# Patient Record
Sex: Female | Born: 1965 | Race: White | Hispanic: No | Smoking: Former smoker
Health system: Southern US, Community
[De-identification: ages and names within clinical notes are randomized; demographics above are authoritative.]

## PROBLEM LIST (undated history)

## (undated) DIAGNOSIS — F419 Anxiety disorder, unspecified: Secondary | ICD-10-CM

## (undated) DIAGNOSIS — E079 Disorder of thyroid, unspecified: Secondary | ICD-10-CM

## (undated) HISTORY — DX: Anxiety disorder, unspecified: F41.9

## (undated) HISTORY — DX: Disorder of thyroid, unspecified: E07.9

## (undated) HISTORY — PX: EYE SURGERY: SHX253

## (undated) HISTORY — PX: ABLATION: SHX5711

## (undated) HISTORY — PX: DILATION AND CURETTAGE OF UTERUS: SHX78

## (undated) HISTORY — PX: CHOLECYSTECTOMY: SHX55

## (undated) HISTORY — PX: OTHER SURGICAL HISTORY: SHX169

## (undated) HISTORY — PX: OOPHORECTOMY: SHX86

---

## 2016-11-02 ENCOUNTER — Ambulatory Visit (INDEPENDENT_AMBULATORY_CARE_PROVIDER_SITE_OTHER): Payer: 59 | Admitting: Obstetrics & Gynecology

## 2016-11-02 ENCOUNTER — Encounter: Payer: Self-pay | Admitting: Obstetrics & Gynecology

## 2016-11-02 VITALS — BP 118/80 | Wt 203.4 lb

## 2016-11-02 DIAGNOSIS — N914 Secondary oligomenorrhea: Secondary | ICD-10-CM

## 2016-11-02 DIAGNOSIS — R102 Pelvic and perineal pain: Secondary | ICD-10-CM | POA: Diagnosis not present

## 2016-11-02 LAB — CBC
HEMATOCRIT: 39.7 % (ref 35.0–45.0)
HEMOGLOBIN: 13.5 g/dL (ref 11.7–15.5)
MCH: 30.3 pg (ref 27.0–33.0)
MCHC: 34 g/dL (ref 32.0–36.0)
MCV: 89 fL (ref 80.0–100.0)
MPV: 9.3 fL (ref 7.5–12.5)
Platelets: 323 10*3/uL (ref 140–400)
RBC: 4.46 MIL/uL (ref 3.80–5.10)
RDW: 13.3 % (ref 11.0–15.0)
WBC: 9.6 10*3/uL (ref 3.8–10.8)

## 2016-11-02 NOTE — Patient Instructions (Signed)
Your Gynecologic exam revealed a tender uterus, but was otherwise normal.  Labs were drawn including a CBC and a sPT.  Please f/u with a Pelvic US asap.  We will obtain your Medical record from [facility]. It was a pleasure to see you today.

## 2016-11-02 NOTE — Progress Notes (Signed)
    Claudia Matthews 14-Sep-1965 [id number]        51 y.o.  [id number] Married  Declines contraception.  H/O Endometrial Ablation and Lt Oophorectomy.  Aex 09/2016 with ASCUS/HPV HR neg.  Seen by Urology last week.  U/A neg, no hematuria and no cystitis.  Established patient presenting for Pelvic Pain  Pelvic crampy pain x 5 days.  Had a light vaginal bleeding x 3 days starting 4/16th. Oligo x many years, s/p Endometrial ablation and Lt Oophorectomy.  Started having pelvic cramping with mid line pain the day after, on 4/19th.  Cramping persists x 5 days.  No vaginal d/c.  No fever.   Past medical history,surgical history, problem list, medications, allergies, family history and social history were all reviewed and documented in the EPIC chart.  Directed ROS with pertinent positives and negatives documented in the history of present illness/assessment and plan.  Exam:  Vitals:   11/02/16 1430  BP: 118/80  Weight: 203 lb 6.4 oz (92.3 kg)   General appearance:  Normal  Gyn exam:  Uterus AV, normal volume, mildly tender.                     No adnexal mass (S/P Lt Oophorectomy)  Assessment/Plan:  51 y.o. [id number] presenting for Pelvic Pain.  1. Female pelvic pain Uterus tender on Bimanual exam, otherwise pelvic exam normal.  Declines STI screen.  No contraception, declines.  S/P Lt Oophorectomy.  Will assess Rt Ovary by [place]. - CBC - hCG, serum, qualitative - US Transvaginal Non-OB; Future  2. Secondary oligomenorrhea Probably Perimenopausal.  Recent light menses x 3 days 4/16th.  F/U Pelvic US to assess Endometrial Line.  Counseling >50% x 25 min on above issues.  Genia Del MD, 3:02 PM 11/02/2016

## 2016-11-03 ENCOUNTER — Ambulatory Visit: Payer: Self-pay | Admitting: Obstetrics & Gynecology

## 2016-11-03 LAB — HCG, SERUM, QUALITATIVE: PREG SERUM: NEGATIVE

## 2016-11-04 ENCOUNTER — Other Ambulatory Visit: Payer: Self-pay | Admitting: Obstetrics & Gynecology

## 2016-11-04 ENCOUNTER — Ambulatory Visit (INDEPENDENT_AMBULATORY_CARE_PROVIDER_SITE_OTHER): Payer: 59 | Admitting: Obstetrics & Gynecology

## 2016-11-04 ENCOUNTER — Ambulatory Visit (INDEPENDENT_AMBULATORY_CARE_PROVIDER_SITE_OTHER): Payer: 59

## 2016-11-04 DIAGNOSIS — R102 Pelvic and perineal pain: Secondary | ICD-10-CM

## 2016-11-04 DIAGNOSIS — N8300 Follicular cyst of ovary, unspecified side: Secondary | ICD-10-CM

## 2016-11-04 DIAGNOSIS — N914 Secondary oligomenorrhea: Secondary | ICD-10-CM

## 2016-11-04 MED ORDER — NORETHINDRONE 0.35 MG PO TABS: 1.0000 | ORAL_TABLET | Freq: Every day | ORAL | 4 refills | Status: DC

## 2016-11-04 NOTE — Progress Notes (Signed)
    Claudia Matthews 04-07-1966 [id number]        51 y.o.  [id number] F/U Pelvic pain/Cramps with Oligomenorrhea.  Past medical history,surgical history, problem list, medications, allergies, family history and social history were all reviewed and documented in the EPIC chart.  Directed ROS with pertinent positives and negatives documented in the history of present illness/assessment and plan.  Exam:  There were no vitals filed for this visit. General appearance:  Normal  Pelvic US today: TV:  AV Normal volume uterus with probable Adenomyosis, possible IM Myoma 4.3 cm.  Rt Ovary with 2 follicles 2.7 cm and 2.7 cm.  Lt Ovary normal.  No FF CDS.    Assessment/Plan:  51 y.o. [id number]  1. Female pelvic pain Probably Ovulatory and secondary to Adenomyosis with Menses.  Decision to control with Progestin-only BCPs.  Ortho-Micronor sent.  Usage/risks reviewed.  2. Secondary oligomenorrhea Perimenopausal.  Pelvic US reassuring.  Endometrial lining 5.9 mm.  Counseling >50% x 15 min.    Genia Del MD, 2:48 PM 11/04/2016

## 2016-11-04 NOTE — Patient Instructions (Signed)
Your Pelvic US was reassuring today, but showed probable Adenomyosis which can cause severe Uterine cramping.  The Endometrial lining was at 5.9 mm which is reassuring and your ovaries with normal with 2 follicles on the Rt ovary.  A Progestin-only pill was sent to your pharmacy, you can start it today.  It should control your cycle and improve your symptoms.

## 2016-12-11 ENCOUNTER — Ambulatory Visit: Payer: 59 | Admitting: Obstetrics & Gynecology

## 2017-03-09 ENCOUNTER — Other Ambulatory Visit: Payer: Self-pay | Admitting: Obstetrics & Gynecology

## 2017-03-09 ENCOUNTER — Encounter: Payer: Self-pay | Admitting: Obstetrics & Gynecology

## 2017-03-09 ENCOUNTER — Ambulatory Visit (INDEPENDENT_AMBULATORY_CARE_PROVIDER_SITE_OTHER): Payer: 59 | Admitting: Obstetrics & Gynecology

## 2017-03-09 VITALS — BP 148/90

## 2017-03-09 DIAGNOSIS — N951 Menopausal and female climacteric states: Secondary | ICD-10-CM

## 2017-03-09 DIAGNOSIS — R300 Dysuria: Secondary | ICD-10-CM | POA: Diagnosis not present

## 2017-03-09 LAB — URINALYSIS W MICROSCOPIC + REFLEX CULTURE
BILIRUBIN URINE: NEGATIVE
CRYSTALS: NONE SEEN [HPF]
Casts: NONE SEEN [LPF]
GLUCOSE, UA: NEGATIVE
Ketones, ur: NEGATIVE
Leukocytes, UA: NEGATIVE
NITRITE: NEGATIVE
PROTEIN: NEGATIVE
SPECIFIC GRAVITY, URINE: 1.02 (ref 1.001–1.035)
Yeast: NONE SEEN [HPF]
pH: 7 (ref 5.0–8.0)

## 2017-03-09 MED ORDER — ESTRADIOL-NORETHINDRONE ACET 0.05-0.25 MG/DAY TD PTTW: 1.0000 | MEDICATED_PATCH | TRANSDERMAL | 4 refills | Status: DC

## 2017-03-09 NOTE — Progress Notes (Signed)
    Donnae Donna 1965-11-06 [id number]        51 y.o.  [id number]   RP:  Severe menopausal Sxs x 3 wks  HPI:  Severe Hot flashes and night sweats x 3 wks.  Waking up around 1-2 am every night, completely soaked and hot.  "Grouchy" and tired, feels like she needs time off work.  Low libido. Seen by Melvenia Needles MD Dr Willaim Bane.  Per patient FSH very high, will bring result.  Also high TSH, Levothyroxine increased from 137 to 175 mcg daily.  No Fam h/o Stroke or Breast Cancer.  Past medical history,surgical history, problem list, medications, allergies, family history and social history were all reviewed and documented in the EPIC chart.  Directed ROS with pertinent positives and negatives documented in the history of present illness/assessment and plan.  Exam:  Vitals:   03/09/17 1537  BP: (!) 148/90   General appearance:  Normal  U/A Nit Neg.  0-5 WBC, 0-2 RBC, few bacteria.  Pending U. Culture  Assessment/Plan:  51 y.o. [id number]   1. Menopause syndrome Severe vasomotor Sxs and Vaginal dryness.  Low libido and moody.  No contraindication to HRT.  Risks of Breast Ca and Stroke reviewed.  Benefits of HRT also reviewed including control of Sxs, Bone protection, skin, libido and prevention of vaginal atrophy. Decision to start on Combipatch, prescription sent to pharmacy.  Vit D/Ca++ supplement recommended.  Weight bearing physical activity recommended.  2. Dysuria U/A mildly abnormal, but Nit Neg.  Will await U. Culture result before treating.  Counseling on above issues >50% x 25 minutes  Genia Del MD, 3:45 PM 03/09/2017

## 2017-03-11 ENCOUNTER — Ambulatory Visit: Payer: 59 | Admitting: Obstetrics & Gynecology

## 2017-03-11 NOTE — Patient Instructions (Signed)
1. Menopause syndrome Severe vasomotor Sxs and Vaginal dryness.  Low libido and moody.  No contraindication to HRT.  Risks of Breast Ca and Stroke reviewed.  Benefits of HRT also reviewed including control of Sxs, Bone protection, skin, libido and prevention of vaginal atrophy. Decision to start on Combipatch, prescription sent to pharmacy.  Vit D/Ca++ supplement recommended.  Weight bearing physical activity recommended.  2. Dysuria U/A mildly abnormal, but Nit Neg.  Will await U. Culture result before treating.  Claudia Matthews, good to see you today!   Menopause and Hormone Replacement Therapy What is hormone replacement therapy? Hormone replacement therapy (HRT) is the use of artificial (synthetic) hormones to replace hormones that your body stops producing during menopause. Menopause is the normal time of life when menstrual periods stop completely and the ovaries stop producing the female hormones estrogen and progesterone. This lack of hormones can affect your health and cause undesirable symptoms. HRT can relieve some of those symptoms. What are my options for HRT? HRT may consist of the synthetic hormones estrogen and progestin, or it may consist of only estrogen (estrogen-only therapy). You and your health care provider will decide which form of HRT is best for you. If you choose to be on HRT and you have a uterus, estrogen and progestin are usually prescribed. Estrogen-only therapy is used for women who do not have a uterus. Possible options for taking HRT include:  Pills.  Patches.  Gels.  Sprays.  Vaginal cream.  Vaginal rings.  Vaginal inserts.  The amount of hormone(s) that you take and how long you take the hormone(s) varies depending on your individual health. It is important to:  Begin HRT with the lowest possible dosage.  Stop HRT as soon as your health care provider tells you to stop.  Work with your health care provider so that you feel informed and comfortable with  your decisions.  What are the benefits of HRT? HRT can reduce the frequency and severity of menopausal symptoms. Benefits of HRT vary depending on the menopausal symptoms that you have, the severity of your symptoms, and your overall health. HRT may help to improve the following menopausal symptoms:  Hot flashes and night sweats. These are sudden feelings of heat that spread over the face and body. The skin may turn red, like a blush. Night sweats are hot flashes that happen while you are sleeping or trying to sleep.  Bone loss (osteoporosis). The body loses calcium more quickly after menopause, causing the bones to become weaker. This can increase the risk for bone breaks (fractures).  Vaginal dryness. The lining of the vagina can become thin and dry, which can cause pain during sexual intercourse or cause infection, burning, or itching.  Urinary tract infections.  Urinary incontinence. This is a decreased ability to control when you urinate.  Irritability.  Short-term memory problems.  What are the risks of HRT? Risks of HRT vary depending on your individual health and medical history. Risks of HRT also depend on whether you receive both estrogen and progestin or you receive estrogen only.HRT may increase the risk of:  Spotting. This is when a small amount of bloodleaks from the vagina unexpectedly.  Endometrial cancer. This cancer is in the lining of the uterus (endometrium).  Breast cancer.  Increased density of breast tissue. This can make it harder to find breast cancer on a breast X-ray (mammogram).  Stroke.  Heart attack.  Blood clots.  Gallbladder disease.  Risks of HRT can increase if you  have any of the following conditions:  Endometrial cancer.  Liver disease.  Heart disease.  Breast cancer.  History of blood clots.  History of stroke.  How should I care for myself while I am on HRT?  Take over-the-counter and prescription medicines only as told by  your health care provider.  Get mammograms, pelvic exams, and medical checkups as often as told by your health care provider.  Have Pap tests done as often as told by your health care provider. A Pap test is sometimes called a Pap smear. It is a screening test that is used to check for signs of cancer of the cervix and vagina. A Pap test can also identify the presence of infection or precancerous changes. Pap tests may be done: ? Every 3 years, starting at age 51. ? Every 5 years, starting after age 51, in combination with testing for human papillomavirus (HPV). ? More often or less often depending on other medical conditions you have, your age, and other risk factors.  It is your responsibility to get your Pap test results. Ask your health care provider or the department performing the test when your results will be ready.  Keep all follow-up visits as told by your health care provider. This is important. When should I seek medical care? Talk with your health care provider if:  You have any of these: ? Pain or swelling in your legs. ? Shortness of breath. ? Chest pain. ? Lumps or changes in your breasts or armpits. ? Slurred speech. ? Pain, burning, or bleeding when you urine.  You develop any of these: ? Unusual vaginal bleeding. ? Dizziness or headaches. ? Weakness or numbness in any part of your arms or legs. ? Pain in your abdomen.  This information is not intended to replace advice given to you by your health care provider. Make sure you discuss any questions you have with your health care provider. Document Released: 03/28/2003 Document Revised: 05/26/2016 Document Reviewed: 12/31/2014 Elsevier Interactive Patient Education  2017 [employer]

## 2017-03-12 ENCOUNTER — Other Ambulatory Visit: Payer: Self-pay | Admitting: *Deleted

## 2017-03-12 LAB — URINE CULTURE

## 2017-03-12 MED ORDER — NITROFURANTOIN MONOHYD MACRO 100 MG PO CAPS: 100.0000 mg | ORAL_CAPSULE | Freq: Two times a day (BID) | ORAL | 0 refills | Status: DC

## 2017-03-22 ENCOUNTER — Ambulatory Visit: Payer: Self-pay | Admitting: Obstetrics & Gynecology

## 2018-04-11 ENCOUNTER — Ambulatory Visit (INDEPENDENT_AMBULATORY_CARE_PROVIDER_SITE_OTHER): Payer: BLUE CROSS/BLUE SHIELD | Admitting: Obstetrics & Gynecology

## 2018-04-11 ENCOUNTER — Encounter: Payer: Self-pay | Admitting: Obstetrics & Gynecology

## 2018-04-11 VITALS — BP 130/90 | Ht 68.5 in | Wt 213.0 lb

## 2018-04-11 DIAGNOSIS — Z79890 Hormone replacement therapy (postmenopausal): Secondary | ICD-10-CM

## 2018-04-11 DIAGNOSIS — Z1151 Encounter for screening for human papillomavirus (HPV): Secondary | ICD-10-CM

## 2018-04-11 DIAGNOSIS — N812 Incomplete uterovaginal prolapse: Secondary | ICD-10-CM | POA: Diagnosis not present

## 2018-04-11 DIAGNOSIS — Z01419 Encounter for gynecological examination (general) (routine) without abnormal findings: Secondary | ICD-10-CM | POA: Diagnosis not present

## 2018-04-11 DIAGNOSIS — Z23 Encounter for immunization: Secondary | ICD-10-CM

## 2018-04-11 DIAGNOSIS — N8110 Cystocele, unspecified: Secondary | ICD-10-CM | POA: Diagnosis not present

## 2018-04-11 MED ORDER — NORETHINDRONE-ETH ESTRADIOL 0.5-2.5 MG-MCG PO TABS: 1.0000 | ORAL_TABLET | Freq: Every day | ORAL | 4 refills | Status: DC

## 2018-04-11 NOTE — Progress Notes (Signed)
Claudia Matthews 1965-11-14 [id number]   History:    52 y.o. [id number] Married  RP:  Established patient presenting for annual gyn exam   HPI: H/O Endometrial ablation and Lt Oophorectomy.  Pap ASCUS/HPV HR neg 09/2016.  Menopausal syndrome with severe vasomotor symptoms, started on Combipatch 02/2017, but stopped because it was too expensive.  Hot flushes/night sweats as severe.  No PMB.  No pelvic pain.  Worried because felt and saw a bulge at vulva while taking her shower yesterday.  Continues to feel it when standing.  No pain with IC.  Mother has pelvic prolapse, using a pessary for many years.  Patient had 2 SVDs.  C/O SUI.  No urgency.  Able to empty her bladder.  BMs wnl.  Breasts normal.  BMI 31.92.  Walks regularly.  Health labs with Fam MD.  Past medical history,surgical history, family history and social history were all reviewed and documented in the EPIC chart.  Gynecologic History No LMP recorded. (Menstrual status: Perimenopausal). Contraception: post menopausal status Last Pap: 09/2016. Results were: ASCUS/HPV HR neg Last mammogram: 2019. Results were: normal per patient.  Will obtain report. Bone Density: Never Colonoscopy: 2012  Obstetric History OB History  Gravida Para Term Preterm AB Living  4 3 1 2 1 3   SAB TAB Ectopic Multiple Live Births  1       3    # Outcome Date GA Lbr Len/2nd Weight Sex Delivery Anes PTL Lv  4 SAB 07/13/97          3 Preterm 01/26/96     Vag-Spont   LIV  2 Preterm 05/22/92     Vag-Spont   LIV  1 Term 05/15/84     Vag-Spont   LIV     ROS: A ROS was performed and pertinent positives and negatives are included in the history.  GENERAL: No fevers or chills. HEENT: No change in vision, no earache, sore throat or sinus congestion. NECK: No pain or stiffness. CARDIOVASCULAR: No chest pain or pressure. No palpitations. PULMONARY: No shortness of breath, cough or wheeze. GASTROINTESTINAL: No abdominal pain, nausea, vomiting or diarrhea, melena or  bright red blood per rectum. GENITOURINARY: No urinary frequency, urgency, hesitancy or dysuria. MUSCULOSKELETAL: No joint or muscle pain, no back pain, no recent trauma. DERMATOLOGIC: No rash, no itching, no lesions. ENDOCRINE: No polyuria, polydipsia, no heat or cold intolerance. No recent change in weight. HEMATOLOGICAL: No anemia or easy bruising or bleeding. NEUROLOGIC: No headache, seizures, numbness, tingling or weakness. PSYCHIATRIC: No depression, no loss of interest in normal activity or change in sleep pattern.     Exam:   BP 130/90   Ht 5' 8.5" (1.74 m)   Wt 213 lb (96.6 kg)   BMI 31.92 kg/m   Body mass index is 31.92 kg/m.  General appearance : Well developed well nourished female. No acute distress HEENT: Eyes: no retinal hemorrhage or exudates,  Neck supple, trachea midline, no carotid bruits, no thyroidmegaly Lungs: Clear to auscultation, no rhonchi or wheezes, or rib retractions  Heart: Regular rate and rhythm, no murmurs or gallops Breast:Examined in sitting and supine position were symmetrical in appearance, no palpable masses or tenderness,  no skin retraction, no nipple inversion, no nipple discharge, no skin discoloration, no axillary or supraclavicular lymphadenopathy Abdomen: no palpable masses or tenderness, no rebound or guarding Extremities: no edema or skin discoloration or tenderness  Pelvic: Vulva: Normal             Vagina:  No gross lesions or discharge  Cervix: No gross lesions or discharge.  Pap/HPV HR done  Uterus  AV, normal size, shape and consistency, non-tender and mobile  Adnexa  Without masses or tenderness  Anus: Normal  Bimanual exam laying down with valsalva:  Bimanual exam in standing position with valsalva:    Assessment/Plan:  52 y.o. female for annual exam   1. Encounter for routine gynecological examination with Papanicolaou smear of cervix Gynecologic exam with cystocele grade 4/4.  Pap with high-risk HPV done today.  Breast exam  normal.  Screening mammogram done in 2019 normal per patient, will obtain report.  Colonoscopy in 2012.  Health labs with family physician.  2. Post-menopause on HRT (hormone replacement therapy) Well on hormone replacement therapy and would like to continue because has significant menopausal vasomotor symptoms.  No contraindication to hormone replacement therapy.  Patch formulation too expensive.  Site decision to switch to [facility] low dose.  Usage reviewed with patient and prescription sent to pharmacy.  3. Baden-Walker grade 4 cystocele Symptomatic cystocele grade 4/4.  Management discussed with patient including observation, pessary or surgical correction.  Decision to try with pessary.  Fitting done today.  Milex ring #5 with support ordered.  Patient will come back for insertion as soon as we have the pessary.  4. Second degree uterine prolapse As above, decision to control with pessary.  Other orders - norethindrone-ethinyl estradiol (FEMHRT LOW DOSE) 0.5-2.5 MG-MCG tablet; Take 1 tablet by mouth daily.  Counseling on above issues and coordination of care more than 50% for 10 minutes.  Genia Del MD, 4:52 PM 04/11/2018

## 2018-04-12 ENCOUNTER — Encounter: Payer: 59 | Admitting: Obstetrics & Gynecology

## 2018-04-12 ENCOUNTER — Encounter: Payer: Self-pay | Admitting: Gynecology

## 2018-04-13 LAB — PAP, TP IMAGING W/ HPV RNA, RFLX HPV TYPE 16,18/45: HPV DNA High Risk: NOT DETECTED

## 2018-04-14 ENCOUNTER — Encounter: Payer: Self-pay | Admitting: Gynecology

## 2018-04-17 ENCOUNTER — Encounter: Payer: Self-pay | Admitting: Obstetrics & Gynecology

## 2018-04-17 NOTE — Patient Instructions (Signed)
1. Encounter for routine gynecological examination with Papanicolaou smear of cervix Gynecologic exam with cystocele grade 4/4.  Pap with high-risk HPV done today.  Breast exam normal.  Screening mammogram done in 2019 normal per patient, will obtain report.  Colonoscopy in 2012.  Health labs with family physician.  2. Post-menopause on HRT (hormone replacement therapy) Well on hormone replacement therapy and would like to continue because has significant menopausal vasomotor symptoms.  No contraindication to hormone replacement therapy.  Patch formulation too expensive.  Site decision to switch to [facility] low dose.  Usage reviewed with patient and prescription sent to pharmacy.  3. Baden-Walker grade 4 cystocele Symptomatic cystocele grade 4/4.  Management discussed with patient including observation, pessary or surgical correction.  Decision to try with pessary.  Fitting done today.  Milex ring #5 with support ordered.  Patient will come back for insertion as soon as we have the pessary.  4. Second degree uterine prolapse As above, decision to control with pessary.  Other orders - norethindrone-ethinyl estradiol (FEMHRT LOW DOSE) 0.5-2.5 MG-MCG tablet; Take 1 tablet by mouth daily.  Juda, it was a pleasure seeing you today!  I will inform you of your results as soon as they are available.

## 2018-04-21 ENCOUNTER — Ambulatory Visit: Payer: BLUE CROSS/BLUE SHIELD | Admitting: Obstetrics & Gynecology

## 2018-04-21 DIAGNOSIS — Z0289 Encounter for other administrative examinations: Secondary | ICD-10-CM

## 2018-04-22 ENCOUNTER — Encounter: Payer: 59 | Admitting: Obstetrics & Gynecology

## 2019-07-31 ENCOUNTER — Other Ambulatory Visit: Payer: Self-pay

## 2019-08-01 ENCOUNTER — Encounter: Payer: Self-pay | Admitting: Obstetrics & Gynecology

## 2019-08-01 ENCOUNTER — Ambulatory Visit (INDEPENDENT_AMBULATORY_CARE_PROVIDER_SITE_OTHER): Payer: BLUE CROSS/BLUE SHIELD | Admitting: Obstetrics & Gynecology

## 2019-08-01 VITALS — BP 130/80 | Ht 68.0 in | Wt 211.0 lb

## 2019-08-01 DIAGNOSIS — Z01419 Encounter for gynecological examination (general) (routine) without abnormal findings: Secondary | ICD-10-CM | POA: Diagnosis not present

## 2019-08-01 DIAGNOSIS — R300 Dysuria: Secondary | ICD-10-CM

## 2019-08-01 DIAGNOSIS — Z113 Encounter for screening for infections with a predominantly sexual mode of transmission: Secondary | ICD-10-CM | POA: Diagnosis not present

## 2019-08-01 DIAGNOSIS — Z780 Asymptomatic menopausal state: Secondary | ICD-10-CM

## 2019-08-01 DIAGNOSIS — Z1151 Encounter for screening for human papillomavirus (HPV): Secondary | ICD-10-CM | POA: Diagnosis not present

## 2019-08-01 DIAGNOSIS — Z6832 Body mass index (BMI) 32.0-32.9, adult: Secondary | ICD-10-CM

## 2019-08-01 DIAGNOSIS — E6609 Other obesity due to excess calories: Secondary | ICD-10-CM

## 2019-08-01 DIAGNOSIS — N8110 Cystocele, unspecified: Secondary | ICD-10-CM | POA: Diagnosis not present

## 2019-08-01 DIAGNOSIS — N3000 Acute cystitis without hematuria: Secondary | ICD-10-CM | POA: Diagnosis not present

## 2019-08-01 MED ORDER — NITROFURANTOIN MONOHYD MACRO 100 MG PO CAPS: 100.0000 mg | ORAL_CAPSULE | Freq: Two times a day (BID) | ORAL | 0 refills | Status: AC

## 2019-08-01 NOTE — Progress Notes (Signed)
Claudia Matthews 1965/10/10 [id number]   History:    54 y.o. [id number] Divorced.  Boyfriend x 1 1/2 yr  RP:  Established patient presenting for annual gyn exam   HPI: H/O Endometrial ablation and Lt Oophorectomy.  Menopausal, well on no HRT.  No PMB.  No pelvic pain.  Bladder bulging at vulva stable x last year.  C/O SUI with coughing.  Burning with urination currently.  No urgency.  Able to empty her bladder.  No pain with IC.  Would like STI screen.  BMs wnl.  Breasts normal.  BMI 31.92 last year, now 32.08.  Walks regularly.  Loosing weight on Drink to loose.  Health labs with Fam MD.  Past medical history,surgical history, family history and social history were all reviewed and documented in the EPIC chart.  Gynecologic History No LMP recorded. (Menstrual status: Perimenopausal).  Obstetric History OB History  Gravida Para Term Preterm AB Living  4 3 1 2 1 3   SAB TAB Ectopic Multiple Live Births  1       3    # Outcome Date GA Lbr Len/2nd Weight Sex Delivery Anes PTL Lv  4 SAB 07/13/97          3 Preterm 01/26/96     Vag-Spont   LIV  2 Preterm 05/22/92     Vag-Spont   LIV  1 Term 05/15/84     Vag-Spont   LIV     ROS: A ROS was performed and pertinent positives and negatives are included in the history.  GENERAL: No fevers or chills. HEENT: No change in vision, no earache, sore throat or sinus congestion. NECK: No pain or stiffness. CARDIOVASCULAR: No chest pain or pressure. No palpitations. PULMONARY: No shortness of breath, cough or wheeze. GASTROINTESTINAL: No abdominal pain, nausea, vomiting or diarrhea, melena or bright red blood per rectum. GENITOURINARY: No urinary frequency, urgency, hesitancy or dysuria. MUSCULOSKELETAL: No joint or muscle pain, no back pain, no recent trauma. DERMATOLOGIC: No rash, no itching, no lesions. ENDOCRINE: No polyuria, polydipsia, no heat or cold intolerance. No recent change in weight. HEMATOLOGICAL: No anemia or easy bruising or bleeding.  NEUROLOGIC: No headache, seizures, numbness, tingling or weakness. PSYCHIATRIC: No depression, no loss of interest in normal activity or change in sleep pattern.     Exam:   BP 130/80   Ht 5\' 8"  (1.727 m)   Wt 211 lb (95.7 kg)   BMI 32.08 kg/m   Body mass index is 32.08 kg/m.  General appearance : Well developed well nourished female. No acute distress HEENT: Eyes: no retinal hemorrhage or exudates,  Neck supple, trachea midline, no carotid bruits, no thyroidmegaly Lungs: Clear to auscultation, no rhonchi or wheezes, or rib retractions  Heart: Regular rate and rhythm, no murmurs or gallops Breast:Examined in sitting and supine position were symmetrical in appearance, no palpable masses or tenderness,  no skin retraction, no nipple inversion, no nipple discharge, no skin discoloration, no axillary or supraclavicular lymphadenopathy Abdomen: no palpable masses or tenderness, no rebound or guarding Extremities: no edema or skin discoloration or tenderness  Pelvic: Vulva: Normal             Vagina: No gross lesions or discharge  Cervix: No gross lesions or discharge.  Pap/HPV HR/Gono-Chlam  Uterus  AV, normal size, shape and consistency, non-tender and mobile  Adnexa  Without masses or tenderness  Anus: Normal  U/A: Yellow cloudy, protein negative, nitrites positive, white blood cells 6-10, red blood cells 0-2,  many bacteria.  Pending urine culture.   Assessment/Plan:  54 y.o. female for annual exam   1. Encounter for routine gynecological examination with Papanicolaou smear of cervix Normal gynecologic exam in menopause.  Pap test with high-risk HPV done today.  Breast exam normal.  Recent screening mammogram in 2021, will obtain report.  Colonoscopy in 2011.  Health labs with family physician.  2. Postmenopause Well on no hormone replacement therapy.  No postmenopausal bleeding.  Vitamin D supplements, calcium intake of 1200 mg daily and regular weightbearing physical activity is  recommended.  3. Screen for STD (sexually transmitted disease) - Gonorrhea and chlamydia on Pap - HIV antibody (with reflex) - RPR - Hepatitis C Antibody - Hepatitis B Surface AntiGEN  4. Dysuria Probable acute cystitis per symptoms and urine analysis.  Will treat with Macrobid 100 mg/caps, 1 capsule twice a day for 7 days.  Pending urine culture. - Urinalysis,Complete w/RFL Culture  5. Baden-Walker grade 4 cystocele Mildly symptomatic cystocele grade 4/4.  Stress urinary incontinence.  Referred to physical therapy for pelvic floor reinforcement.  6. Class 1 obesity due to excess calories without serious comorbidity with body mass index (BMI) of 32.0 to 32.9 in adult Patient losing weight currently on doing to lose.  Recommend to continue on a low calorie/carb diet.  Aerobic physical activities 5 times a week and light weightlifting every 2 days recommended.  Other orders - ALPRAZolam (XANAX) 0.25 MG tablet; Take 0.25 mg by mouth at bedtime as needed for anxiety. - nitrofurantoin, macrocrystal-monohydrate, (MACROBID) 100 MG capsule; Take 1 capsule (100 mg total) by mouth 2 (two) times daily for 7 days.  Princess Bruins MD, 9:01 AM 08/01/2019

## 2019-08-02 ENCOUNTER — Telehealth: Payer: Self-pay | Admitting: *Deleted

## 2019-08-02 DIAGNOSIS — N393 Stress incontinence (female) (male): Secondary | ICD-10-CM

## 2019-08-02 LAB — HEPATITIS B SURFACE ANTIGEN: Hepatitis B Surface Ag: NONREACTIVE

## 2019-08-02 LAB — HEPATITIS C ANTIBODY
Hepatitis C Ab: NONREACTIVE
SIGNAL TO CUT-OFF: 0.01 (ref <1.00)

## 2019-08-02 LAB — RPR: RPR Ser Ql: NONREACTIVE

## 2019-08-02 LAB — HIV ANTIBODY (ROUTINE TESTING W REFLEX): HIV 1&2 Ab, 4th Generation: NONREACTIVE

## 2019-08-02 NOTE — Telephone Encounter (Signed)
I called patient to let her know referral had been placed at [facility], however patient lives in [place] and prefer a office there. I told patient she can find a office and call me back and I can fax notes.

## 2019-08-02 NOTE — Telephone Encounter (Signed)
-----   Message from Genia Del, MD sent at 08/01/2019  9:27 AM EST ----- Regarding: Refer to Physical Therapy SUI, Cystocele for Pelvic Floor Physical Therapy.

## 2019-08-03 LAB — URINALYSIS, COMPLETE W/RFL CULTURE
Bilirubin Urine: NEGATIVE
Glucose, UA: NEGATIVE
Hyaline Cast: NONE SEEN /LPF
Ketones, ur: NEGATIVE
Leukocyte Esterase: NEGATIVE
Nitrites, Initial: POSITIVE — AB
Protein, ur: NEGATIVE
Specific Gravity, Urine: 1.015 (ref 1.001–1.03)
pH: 6 (ref 5.0–8.0)

## 2019-08-03 LAB — URINE CULTURE
MICRO NUMBER:: 10056475
SPECIMEN QUALITY:: ADEQUATE

## 2019-08-03 LAB — CULTURE INDICATED

## 2019-08-04 LAB — PAP IG, CT-NG NAA, HPV HIGH-RISK
C. trachomatis RNA, TMA: NOT DETECTED
HPV DNA High Risk: NOT DETECTED
N. gonorrhoeae RNA, TMA: NOT DETECTED

## 2019-08-06 NOTE — Patient Instructions (Signed)
1. Encounter for routine gynecological examination with Papanicolaou smear of cervix Normal gynecologic exam in menopause.  Pap test with high-risk HPV done today.  Breast exam normal.  Recent screening mammogram in 2021, will obtain report.  Colonoscopy in 2011.  Health labs with family physician.  2. Postmenopause Well on no hormone replacement therapy.  No postmenopausal bleeding.  Vitamin D supplements, calcium intake of 1200 mg daily and regular weightbearing physical activity is recommended.  3. Screen for STD (sexually transmitted disease) - Gonorrhea and chlamydia on Pap - HIV antibody (with reflex) - RPR - Hepatitis C Antibody - Hepatitis B Surface AntiGEN  4. Dysuria Probable acute cystitis per symptoms and urine analysis.  Will treat with Macrobid 100 mg/caps, 1 capsule twice a day for 7 days.  Pending urine culture. - Urinalysis,Complete w/RFL Culture  5. Baden-Walker grade 4 cystocele Mildly symptomatic cystocele grade 4/4.  Stress urinary incontinence.  Referred to physical therapy for pelvic floor reinforcement.  6. Class 1 obesity due to excess calories without serious comorbidity with body mass index (BMI) of 32.0 to 32.9 in adult Patient losing weight currently on doing to lose.  Recommend to continue on a low calorie/carb diet.  Aerobic physical activities 5 times a week and light weightlifting every 2 days recommended.  Other orders - ALPRAZolam (XANAX) 0.25 MG tablet; Take 0.25 mg by mouth at bedtime as needed for anxiety. - nitrofurantoin, macrocrystal-monohydrate, (MACROBID) 100 MG capsule; Take 1 capsule (100 mg total) by mouth 2 (two) times daily for 7 days.  Claudia Matthews, it was a pleasure seeing you today!  I will inform you of your results as soon as they are available.

## 2020-08-02 ENCOUNTER — Encounter: Payer: BLUE CROSS/BLUE SHIELD | Admitting: Obstetrics & Gynecology

## 2020-08-30 ENCOUNTER — Other Ambulatory Visit: Payer: Self-pay

## 2020-08-30 ENCOUNTER — Ambulatory Visit (INDEPENDENT_AMBULATORY_CARE_PROVIDER_SITE_OTHER): Payer: BC Managed Care – PPO | Admitting: Obstetrics & Gynecology

## 2020-08-30 ENCOUNTER — Encounter: Payer: Self-pay | Admitting: Obstetrics & Gynecology

## 2020-08-30 VITALS — BP 130/80 | Ht 68.0 in | Wt 213.0 lb

## 2020-08-30 DIAGNOSIS — N951 Menopausal and female climacteric states: Secondary | ICD-10-CM | POA: Diagnosis not present

## 2020-08-30 DIAGNOSIS — E6609 Other obesity due to excess calories: Secondary | ICD-10-CM

## 2020-08-30 DIAGNOSIS — Z6832 Body mass index (BMI) 32.0-32.9, adult: Secondary | ICD-10-CM | POA: Diagnosis not present

## 2020-08-30 DIAGNOSIS — Z01419 Encounter for gynecological examination (general) (routine) without abnormal findings: Secondary | ICD-10-CM

## 2020-08-30 MED ORDER — ESTRADIOL 0.05 MG/24HR TD PTWK: 0.0500 mg | MEDICATED_PATCH | TRANSDERMAL | 12 refills | Status: DC

## 2020-08-30 MED ORDER — PROGESTERONE MICRONIZED 100 MG PO CAPS: 100.0000 mg | ORAL_CAPSULE | Freq: Every day | ORAL | 4 refills | Status: DC

## 2020-08-30 NOTE — Progress Notes (Signed)
Claudia Matthews 08/04/1965 [id number]   History:    55 y.o. [id number] Divorced.  Boyfriend x 2 1/2 yr  EZ:[id number] presenting for annual gyn exam   HPI:H/O Endometrial ablation and Lt Oophorectomy. Postmenopausal on no HRT with hot flushes and night sweats preventing sleep. No PMB. No pelvic pain.  Mild bladder descent stable.  Doing Kegels daily.  Improved mild SUI.  No pain with IC. BMs wnl. Breasts normal. BMI 32.39. Walks regularly.  Health labs with Fam MD.  Past medical history,surgical history, family history and social history were all reviewed and documented in the EPIC chart.  Gynecologic History No LMP recorded. (Menstrual status: Postmenopausal)  Obstetric History OB History  Gravida Para Term Preterm AB Living  4 3 1 2 1 3   SAB IAB Ectopic Multiple Live Births  1       3    # Outcome Date GA Lbr Len/2nd Weight Sex Delivery Anes PTL Lv  4 SAB 07/13/97          3 Preterm 01/26/96     Vag-Spont   LIV  2 Preterm 05/22/92     Vag-Spont   LIV  1 Term 05/15/84     Vag-Spont   LIV     ROS: A ROS was performed and pertinent positives and negatives are included in the history.  GENERAL: No fevers or chills. HEENT: No change in vision, no earache, sore throat or sinus congestion. NECK: No pain or stiffness. CARDIOVASCULAR: No chest pain or pressure. No palpitations. PULMONARY: No shortness of breath, cough or wheeze. GASTROINTESTINAL: No abdominal pain, nausea, vomiting or diarrhea, melena or bright red blood per rectum. GENITOURINARY: No urinary frequency, urgency, hesitancy or dysuria. MUSCULOSKELETAL: No joint or muscle pain, no back pain, no recent trauma. DERMATOLOGIC: No rash, no itching, no lesions. ENDOCRINE: No polyuria, polydipsia, no heat or cold intolerance. No recent change in weight. HEMATOLOGICAL: No anemia or easy bruising or bleeding. NEUROLOGIC: No headache, seizures, numbness, tingling or weakness. PSYCHIATRIC: No depression, no loss of  interest in normal activity or change in sleep pattern.     Exam:   BP 130/80   Ht 5\' 8"  (1.727 m)   Wt 213 lb (96.6 kg)   LMP 10/26/2016 (Exact Date) Comment: ablation  BMI 32.39 kg/m   Body mass index is 32.39 kg/m.  General appearance : Well developed well nourished female. No acute distress HEENT: Eyes: no retinal hemorrhage or exudates,  Neck supple, trachea midline, no carotid bruits, no thyroidmegaly Lungs: Clear to auscultation, no rhonchi or wheezes, or rib retractions  Heart: Regular rate and rhythm, no murmurs or gallops Breast:Examined in sitting and supine position were symmetrical in appearance, no palpable masses or tenderness,  no skin retraction, no nipple inversion, no nipple discharge, no skin discoloration, no axillary or supraclavicular lymphadenopathy Abdomen: no palpable masses or tenderness, no rebound or guarding Extremities: no edema or skin discoloration or tenderness  Pelvic: Vulva: Normal             Vagina: No gross lesions or discharge  Cervix: No gross lesions or discharge  Uterus  AV, normal size, shape and consistency, non-tender and mobile  Adnexa  Without masses or tenderness  Anus: Normal   Assessment/Plan:  55 y.o. female for annual exam   1. Well female exam with routine gynecological exam Normal gynecologic exam.  No indication for Pap test this year, last Pap test neg January 2021.  Breast exam normal.  Screening mammogram December 2021 was  negative.  Needs to schedule colonoscopy.  Health labs with family physician.  2. Postmenopausal syndrome Severe hot flashes with night sweats causing insomnia.  Patient desires starting on hormone replacement therapy.  No contraindication to hormone replacement therapy.  Counseling on risks and benefits associated with hormone replacement therapy done.  Different regimens considered.  Decision to start on estradiol 0.05 patch once a week with progesterone 100 mg/caps daily at bedtime.  Usage reviewed  and prescription sent to pharmacy.  Recommend vitamin D supplements, calcium intake of 1.5 g/day total and regular weightbearing physical activities.  3. Class 1 obesity due to excess calories without serious comorbidity with body mass index (BMI) of 32.0 to 32.9 in adult Recommend a lower calorie/carb diet.  Intermittent fasting to consider.  Aerobic activities 5 times a week and light weightlifting every 2 days.  Other orders - estradiol (CLIMARA - DOSED IN MG/24 HR) 0.05 mg/24hr patch; Place 1 patch (0.05 mg total) onto the skin once a week. - progesterone (PROMETRIUM) 100 MG capsule; Take 1 capsule (100 mg total) by mouth at bedtime.  Genia Del MD, 3:55 PM 08/30/2020

## 2020-09-06 ENCOUNTER — Telehealth: Payer: Self-pay | Admitting: *Deleted

## 2020-09-06 NOTE — Telephone Encounter (Signed)
Recommend to stop the Estradiol patch and continue on the Prometrium only at this time.  She can update [place] after 2 weeks on the Prometrium only.

## 2020-09-06 NOTE — Telephone Encounter (Signed)
Patient informed. 

## 2020-09-06 NOTE — Telephone Encounter (Signed)
Patient called was seen on 08/30/20 and started on Climara patch, called today c/o daily headache at the top of her head. She said you told her to call if any problems. She asked if another patch can be prescribed?

## 2020-09-07 ENCOUNTER — Encounter: Payer: Self-pay | Admitting: Obstetrics & Gynecology

## 2021-01-07 ENCOUNTER — Encounter: Payer: Self-pay | Admitting: Obstetrics & Gynecology

## 2021-04-01 ENCOUNTER — Telehealth: Payer: Self-pay

## 2021-04-01 DIAGNOSIS — N63 Unspecified lump in unspecified breast: Secondary | ICD-10-CM

## 2021-04-01 DIAGNOSIS — N6019 Diffuse cystic mastopathy of unspecified breast: Secondary | ICD-10-CM

## 2021-04-01 NOTE — Telephone Encounter (Signed)
Patient called to inquire about colonoscopy as Dr. Mackey Birchwood had recommended it. Advised patient that as long as it is just for screening she can call and schedule. I provided [facility] phone number.  She also asked me about scheduling her 6 mos diagnostic breast imaging with [facility]. I called [facility] and they said patient will have to have her films and report to them before they will schedule. Patient was informed and she will contact the previous facility and make these arrangements and then I will schedule her for December.

## 2021-04-07 NOTE — Telephone Encounter (Signed)
Diagnostic studies have been scheduled at [facility] for 06/20/21.

## 2021-06-20 ENCOUNTER — Other Ambulatory Visit: Payer: Self-pay | Admitting: Obstetrics & Gynecology

## 2021-06-20 ENCOUNTER — Ambulatory Visit
Admission: RE | Admit: 2021-06-20 | Discharge: 2021-06-20 | Disposition: A | Discharge status: Home / Self Care | Payer: BC Managed Care – PPO | Source: Ambulatory Visit | Attending: Obstetrics & Gynecology | Admitting: Obstetrics & Gynecology

## 2021-06-20 ENCOUNTER — Ambulatory Visit: Payer: BC Managed Care – PPO

## 2021-06-20 DIAGNOSIS — N63 Unspecified lump in unspecified breast: Secondary | ICD-10-CM

## 2021-06-20 DIAGNOSIS — N6019 Diffuse cystic mastopathy of unspecified breast: Secondary | ICD-10-CM

## 2021-06-20 DIAGNOSIS — R928 Other abnormal and inconclusive findings on diagnostic imaging of breast: Secondary | ICD-10-CM

## 2021-08-29 ENCOUNTER — Other Ambulatory Visit: Payer: Self-pay | Admitting: Obstetrics & Gynecology

## 2021-08-29 NOTE — Telephone Encounter (Signed)
Last AEX 08/30/20. Scheduled for 10/10/21. Last mammo 06/20/21. Scheduled for 12/26/21.

## 2021-09-16 ENCOUNTER — Other Ambulatory Visit: Payer: Self-pay | Admitting: Obstetrics & Gynecology

## 2021-09-16 NOTE — Telephone Encounter (Signed)
Last AEX 08/30/20. ?AEX scheduled for 10/10/21.  ?Mammo UTD and scheduled for 12/26/21.  ? ? ?Denied patches due to sending a months Rx on 09/03/21.  ?

## 2021-10-08 ENCOUNTER — Telehealth: Payer: Self-pay

## 2021-10-08 NOTE — Telephone Encounter (Signed)
Patient called in voice mail. I returned her call. She thought that triage had called her. I did not see anywhere we had called her. She has her AEX appt on Friday 09/30/21. ?

## 2021-10-10 ENCOUNTER — Encounter: Payer: Self-pay | Admitting: Obstetrics & Gynecology

## 2021-10-10 ENCOUNTER — Ambulatory Visit (INDEPENDENT_AMBULATORY_CARE_PROVIDER_SITE_OTHER): Payer: BC Managed Care – PPO | Admitting: Obstetrics & Gynecology

## 2021-10-10 VITALS — BP 120/76 | HR 70 | Resp 16 | Ht 67.75 in | Wt 218.0 lb

## 2021-10-10 DIAGNOSIS — R300 Dysuria: Secondary | ICD-10-CM

## 2021-10-10 DIAGNOSIS — Z6833 Body mass index (BMI) 33.0-33.9, adult: Secondary | ICD-10-CM | POA: Diagnosis not present

## 2021-10-10 DIAGNOSIS — Z79890 Hormone replacement therapy (postmenopausal): Secondary | ICD-10-CM

## 2021-10-10 DIAGNOSIS — E6609 Other obesity due to excess calories: Secondary | ICD-10-CM | POA: Diagnosis not present

## 2021-10-10 DIAGNOSIS — Z01419 Encounter for gynecological examination (general) (routine) without abnormal findings: Secondary | ICD-10-CM | POA: Diagnosis not present

## 2021-10-10 MED ORDER — PROGESTERONE MICRONIZED 100 MG PO CAPS: 100.0000 mg | ORAL_CAPSULE | Freq: Every day | ORAL | 4 refills | Status: DC

## 2021-10-10 MED ORDER — NITROFURANTOIN MONOHYD MACRO 100 MG PO CAPS: 100.0000 mg | ORAL_CAPSULE | Freq: Two times a day (BID) | ORAL | 0 refills | Status: AC

## 2021-10-10 MED ORDER — ESTRADIOL 0.05 MG/24HR TD PTWK: 0.0500 mg | MEDICATED_PATCH | TRANSDERMAL | 4 refills | Status: DC

## 2021-10-10 NOTE — Progress Notes (Signed)
? ? ?Claudia Matthews 12-16-1965 [id number] ? ? ?History:    56 y.o.  [id number] Divorced.  Boyfriend x 3 1/2 yr ?  ?RP:  Established patient presenting for annual gyn exam  ?  ?HPI: H/O Endometrial ablation and Lt Oophorectomy.  Postmenopausal welll on HRT with Estradiol patch 0.05 weekly and Prometrium 100 mg HS.  No PMB.  No pelvic pain. No pain with IC.  Pap Neg 07/2019, will repeat Pap next year.  Complains of burning with urination and urinary frequency x 4 days.  No fever. BMs wnl.  Breasts normal.  Mammo 06/2021 Lt Neg, Rt cyst, probably benign.  F/U Rt Dx mammo/US in 12/2021.  BMI 33.39.  Walks regularly.  Health labs with Fam MD.  Alen Bleacher to schedule with Gastro. ? ? ?Past medical history,surgical history, family history and social history were all reviewed and documented in the EPIC chart. ? ?Gynecologic History ?Patient's last menstrual period was 10/26/2016 (exact date). ? ?Obstetric History ?OB History  ?Gravida Para Term Preterm AB Living  ?4 3 1 2 1 3   ?SAB IAB Ectopic Multiple Live Births  ?1       3  ?  ?# Outcome Date GA Lbr Len/2nd Weight Sex Delivery Anes PTL Lv  ?4 SAB 07/13/97          ?3 Preterm 01/26/96     Vag-Spont   LIV  ?2 Preterm 05/22/92     Vag-Spont   LIV  ?1 Term 05/15/84     Vag-Spont   LIV  ? ? ? ?ROS: A ROS was performed and pertinent positives and negatives are included in the history. ?GENERAL: No fevers or chills. HEENT: No change in vision, no earache, sore throat or sinus congestion. NECK: No pain or stiffness. CARDIOVASCULAR: No chest pain or pressure. No palpitations. PULMONARY: No shortness of breath, cough or wheeze. GASTROINTESTINAL: No abdominal pain, nausea, vomiting or diarrhea, melena or bright red blood per rectum. GENITOURINARY: No urinary frequency, urgency, hesitancy or dysuria. MUSCULOSKELETAL: No joint or muscle pain, no back pain, no recent trauma. DERMATOLOGIC: No rash, no itching, no lesions. ENDOCRINE: No polyuria, polydipsia, no heat or cold intolerance. No  recent change in weight. HEMATOLOGICAL: No anemia or easy bruising or bleeding. NEUROLOGIC: No headache, seizures, numbness, tingling or weakness. PSYCHIATRIC: No depression, no loss of interest in normal activity or change in sleep pattern.  ?  ? ?Exam: ? ? ?BP 120/76   Pulse 70   Resp 16   Ht 5' 7.75" (1.721 m)   Wt 218 lb (98.9 kg)   LMP 10/26/2016 (Exact Date) Comment: ablation  BMI 33.39 kg/m?  ? ?Body mass index is 33.39 kg/m?. ? ?General appearance : Well developed well nourished female. No acute distress ?HEENT: Eyes: no retinal hemorrhage or exudates,  Neck supple, trachea midline, no carotid bruits, no thyroidmegaly ?Lungs: Clear to auscultation, no rhonchi or wheezes, or rib retractions  ?Heart: Regular rate and rhythm, no murmurs or gallops ?Breast:Examined in sitting and supine position were symmetrical in appearance, no palpable masses or tenderness,  no skin retraction, no nipple inversion, no nipple discharge, no skin discoloration, no axillary or supraclavicular lymphadenopathy ?Abdomen: no palpable masses or tenderness, no rebound or guarding ?Extremities: no edema or skin discoloration or tenderness ? ?Pelvic: Vulva: Normal ?            Vagina: No gross lesions or discharge ? Cervix: No gross lesions or discharge ? Uterus AV, normal size, shape and consistency, non-tender and mobile ?  Adnexa  Without masses or tenderness ? Anus: Normal ? ?U/A: Dark yellow slightly cloudy, protein negative, nitrites negative, white blood cells 0-5, red blood cells 3-10, bacteria few.  Urine culture pending. ? ? ?Assessment/Plan:  56 y.o. female for annual exam  ? ?1. Well female exam with routine gynecological exam ?H/O Endometrial ablation and Lt Oophorectomy.  Postmenopausal welll on HRT with Estradiol patch 0.05 weekly and Prometrium 100 mg HS.  No PMB.  No pelvic pain. No pain with IC.  Pap Neg 07/2019, will repeat Pap next year.  Complains of burning with urination and urinary frequency x 4 days.  No  fever. BMs wnl.  Breasts normal.  Mammo 06/2021 Lt Neg, Rt cyst, probably benign.  F/U Rt Dx mammo/US in 12/2021.  BMI 33.39.  Walks regularly.  Health labs with Fam MD.  Alen Bleacher to schedule with Gastro. ? ?2. Postmenopausal hormone replacement therapy ?Postmenopausal welll on HRT with Estradiol patch 0.05 weekly and Prometrium 100 mg HS.  No PMB.  No pelvic pain. No pain with IC.  ? ?3. Burning with urination ?- Urinalysis,Complete w/RFL Culture ? ?4. Class 1 obesity due to excess calories without serious comorbidity with body mass index (BMI) of 33.0 to 33.9 in adult ?Lower calorie/carb diet.  Increase fitness activities. ? ?Other orders ?- albuterol (VENTOLIN HFA) 108 (90 Base) MCG/ACT inhaler; SMARTSIG:1-2 Puff(s) By Mouth Every 4 Hours PRN ?- ALPRAZolam (XANAX) 0.5 MG tablet; Take 0.5 mg by mouth 2 (two) times daily as needed. ?- cyclobenzaprine (FLEXERIL) 10 MG tablet; Take 10 mg by mouth daily as needed. ?- fluticasone (FLONASE) 50 MCG/ACT nasal spray; SMARTSIG:1-2 Spray(s) Both Nares Daily ?- levothyroxine (SYNTHROID) 137 MCG tablet; Take 137 mcg by mouth daily. ?- nitrofurantoin, macrocrystal-monohydrate, (MACROBID) 100 MG capsule; Take 1 capsule (100 mg total) by mouth 2 (two) times daily for 7 days. ?- progesterone (PROMETRIUM) 100 MG capsule; Take 1 capsule (100 mg total) by mouth at bedtime. ?- estradiol (CLIMARA - DOSED IN MG/24 HR) 0.05 mg/24hr patch; Place 1 patch (0.05 mg total) onto the skin once a week.  ? ?Counseling on urinary tract infection symptoms, diagnostic and management for 15 minutes. ? ? ?Genia Del MD, 3:16 PM 10/10/2021 ? ?  ?

## 2021-10-12 LAB — URINALYSIS, COMPLETE W/RFL CULTURE
Glucose, UA: NEGATIVE
Hyaline Cast: NONE SEEN /LPF
Ketones, ur: NEGATIVE
Leukocyte Esterase: NEGATIVE
Nitrites, Initial: NEGATIVE
Protein, ur: NEGATIVE
Specific Gravity, Urine: 1.025 (ref 1.001–1.035)
pH: 6 (ref 5.0–8.0)

## 2021-10-12 LAB — URINE CULTURE
MICRO NUMBER:: 13207455
Result:: NO GROWTH
SPECIMEN QUALITY:: ADEQUATE

## 2021-10-12 LAB — CULTURE INDICATED

## 2021-12-01 ENCOUNTER — Telehealth: Payer: Self-pay | Admitting: *Deleted

## 2021-12-01 NOTE — Telephone Encounter (Signed)
Additional information from the below. Patient reports bleeding is not heavy, changed tampon twice yesterday.

## 2021-12-01 NOTE — Telephone Encounter (Signed)
-----   Message from [place] sent at 12/01/2021  9:35 AM EDT ----- Regarding: Cycle after 5 years Inbound call from pat stating has not had a cycle in 5 years and it just started yesterday. Please advise if this is normal or if an evaluation is needed.  [phone]

## 2021-12-01 NOTE — Telephone Encounter (Signed)
Patient scheduled on 12/05/21 with Dr.lavoie

## 2021-12-01 NOTE — Telephone Encounter (Signed)
I called patient and she reports no bleeding in 5 years. Patient reports bleeding started 11/30/21. I advised patient to schedule OV with provider. Message sent back to appointment to schedule.

## 2021-12-05 ENCOUNTER — Ambulatory Visit: Payer: BC Managed Care – PPO | Admitting: Obstetrics & Gynecology

## 2021-12-05 ENCOUNTER — Encounter: Payer: Self-pay | Admitting: Obstetrics & Gynecology

## 2021-12-05 VITALS — BP 114/80

## 2021-12-05 DIAGNOSIS — Z79890 Hormone replacement therapy (postmenopausal): Secondary | ICD-10-CM | POA: Diagnosis not present

## 2021-12-05 DIAGNOSIS — N950 Postmenopausal bleeding: Secondary | ICD-10-CM | POA: Diagnosis not present

## 2021-12-05 NOTE — Progress Notes (Signed)
    Claudia Matthews 07-Jun-1966 [id number]        56 y.o.  [id number] Divorced.  Boyfriend x 3+ yrs  RP: PMB on HRT  HPI: Mild PMB on HRT x 5 days last week.  No pelvic pain except for cramping while bleeding.  Not bleeding anymore.  No pain with IC.  H/O Endometrial ablation and Lt Oophorectomy. Postmenopausal welll on HRT with Estradiol patch 0.05 weekly and Prometrium 100 mg HS.  Urine/BMs normal.   OB History  Gravida Para Term Preterm AB Living  4 3 1 2 1 3   SAB IAB Ectopic Multiple Live Births  1       3    # Outcome Date GA Lbr Len/2nd Weight Sex Delivery Anes PTL Lv  4 SAB 07/13/97          3 Preterm 01/26/96     Vag-Spont   LIV  2 Preterm 05/22/92     Vag-Spont   LIV  1 Term 05/15/84     Vag-Spont   LIV    Past medical history,surgical history, problem list, medications, allergies, family history and social history were all reviewed and documented in the EPIC chart.   Directed ROS with pertinent positives and negatives documented in the history of present illness/assessment and plan.  Exam:  Vitals:   12/05/21 0928  BP: 114/80   General appearance:  Normal  Abdomen: Normal  Gynecologic exam: Vulva normal.  Speculum:  Cervix normal, very mild blood tinged mucous at the EO.  Vagina normal.  Normal secretions.   Assessment/Plan:  56 y.o. 53   1. Postmenopausal bleeding Mild PMB on HRT x 5 days last week.  No pelvic pain except for cramping while bleeding.  Not bleeding anymore.  No pain with IC.  H/O Endometrial ablation and Lt Oophorectomy. Postmenopausal welll on HRT with Estradiol patch 0.05 weekly and Prometrium 100 mg HS.  Urine/BMs normal.  Gyn exam wnl today.  Will f/u with a Pelvic [id number], possible EBx, to further investigate. - US Transvaginal Non-OB; Future  2. Postmenopausal hormone replacement therapy  Postmenopausal welll on HRT with Estradiol patch 0.05 weekly and Prometrium 100 mg HS x 2 years.  Declines stopping HRT at this time because her  vasomotor menopausal symptoms are too severe.  Will reconsider HRT, especially the dosage, per Pelvic US findings at follow up.  [place] MD, 9:39 AM 12/05/2021

## 2021-12-20 ENCOUNTER — Other Ambulatory Visit: Payer: BC Managed Care – PPO

## 2021-12-25 ENCOUNTER — Other Ambulatory Visit: Payer: BC Managed Care – PPO | Admitting: Obstetrics & Gynecology

## 2021-12-25 ENCOUNTER — Other Ambulatory Visit: Payer: BC Managed Care – PPO

## 2021-12-26 ENCOUNTER — Other Ambulatory Visit: Payer: BC Managed Care – PPO

## 2021-12-29 ENCOUNTER — Ambulatory Visit
Admission: RE | Admit: 2021-12-29 | Discharge: 2021-12-29 | Disposition: A | Discharge status: Home / Self Care | Payer: BC Managed Care – PPO | Source: Ambulatory Visit | Attending: Obstetrics & Gynecology | Admitting: Obstetrics & Gynecology

## 2021-12-29 ENCOUNTER — Other Ambulatory Visit: Payer: Self-pay | Admitting: Obstetrics & Gynecology

## 2021-12-29 DIAGNOSIS — R928 Other abnormal and inconclusive findings on diagnostic imaging of breast: Secondary | ICD-10-CM

## 2022-02-12 ENCOUNTER — Other Ambulatory Visit: Payer: BC Managed Care – PPO

## 2022-02-12 ENCOUNTER — Other Ambulatory Visit: Payer: BC Managed Care – PPO | Admitting: Obstetrics & Gynecology

## 2022-03-05 ENCOUNTER — Other Ambulatory Visit: Payer: BC Managed Care – PPO

## 2022-03-05 ENCOUNTER — Other Ambulatory Visit: Payer: BC Managed Care – PPO | Admitting: Obstetrics & Gynecology

## 2022-05-21 ENCOUNTER — Other Ambulatory Visit: Payer: BC Managed Care – PPO

## 2022-05-21 ENCOUNTER — Other Ambulatory Visit: Payer: BC Managed Care – PPO | Admitting: Obstetrics & Gynecology

## 2022-07-09 ENCOUNTER — Other Ambulatory Visit: Payer: BC Managed Care – PPO

## 2022-07-09 ENCOUNTER — Other Ambulatory Visit: Payer: BC Managed Care – PPO | Admitting: Obstetrics & Gynecology

## 2022-11-01 ENCOUNTER — Other Ambulatory Visit: Payer: Self-pay | Admitting: Obstetrics & Gynecology

## 2022-11-05 ENCOUNTER — Other Ambulatory Visit: Payer: Self-pay | Admitting: Obstetrics & Gynecology

## 2022-11-05 DIAGNOSIS — Z79890 Hormone replacement therapy (postmenopausal): Secondary | ICD-10-CM

## 2022-11-05 NOTE — Telephone Encounter (Signed)
Last AEX 10/10/2021--recall sent for 2024 per EMR. Nothing scheduled. Last mammo 12/29/2021-birads 3 (rec 12 month f/u)  OV for PMB on 12/05/2021-declined stopping HRT at that time d/t severe vasomotor sxs. However, f/u w/ [place] and poss EMB was recommended.  Pt was scheduled/rescheduled 4x for [place] and then cancelled the 5th appt d/t sickness. (Per OV note: Will r/s after 1st of the year.)   I do not see any evidence that pt has contacted office for an appt for PMB eval and/or to schedule AEX. Please advise.

## 2022-11-10 NOTE — Telephone Encounter (Signed)
Per ML: "No refill. Needs an appointment with a pelvic US and Annual Gyn exam."   Dr Elbert Ewings

## 2022-11-12 ENCOUNTER — Other Ambulatory Visit: Payer: Self-pay | Admitting: Obstetrics & Gynecology

## 2022-11-12 DIAGNOSIS — Z79890 Hormone replacement therapy (postmenopausal): Secondary | ICD-10-CM

## 2022-11-13 NOTE — Telephone Encounter (Signed)
FYI. 11/12/2022: Claudia Matthews, Darlin Drop, CMA  Selinda Eon, CMA Patient did not wish to reschedule [place] states "she is no longer bleeding". Patient scheduled aex with [facility] in July due to ML having no open aex appointments before she leaves."

## 2022-11-13 NOTE — Telephone Encounter (Signed)
RF request received for Estradiol 0.5mg  patch. AEX scheduled 01/20/23 Last AEX 10/10/21 PAP 03/31/20 NILM, HPV negative. RF sent through appointment.

## 2022-11-16 ENCOUNTER — Other Ambulatory Visit: Payer: Self-pay | Admitting: Obstetrics & Gynecology

## 2022-11-16 DIAGNOSIS — R928 Other abnormal and inconclusive findings on diagnostic imaging of breast: Secondary | ICD-10-CM

## 2022-12-31 ENCOUNTER — Ambulatory Visit
Admission: RE | Admit: 2022-12-31 | Discharge: 2022-12-31 | Disposition: A | Discharge status: Home / Self Care | Payer: 59 | Source: Ambulatory Visit | Attending: Obstetrics & Gynecology | Admitting: Obstetrics & Gynecology

## 2022-12-31 ENCOUNTER — Ambulatory Visit
Admission: RE | Admit: 2022-12-31 | Discharge: 2022-12-31 | Disposition: A | Discharge status: Home / Self Care | Payer: Self-pay | Source: Ambulatory Visit | Attending: Obstetrics & Gynecology | Admitting: Obstetrics & Gynecology

## 2022-12-31 ENCOUNTER — Other Ambulatory Visit: Payer: Self-pay | Admitting: Obstetrics & Gynecology

## 2022-12-31 DIAGNOSIS — N6489 Other specified disorders of breast: Secondary | ICD-10-CM

## 2022-12-31 DIAGNOSIS — R928 Other abnormal and inconclusive findings on diagnostic imaging of breast: Secondary | ICD-10-CM

## 2023-01-01 ENCOUNTER — Other Ambulatory Visit: Payer: Self-pay

## 2023-01-20 ENCOUNTER — Ambulatory Visit: Payer: Self-pay | Admitting: Radiology

## 2023-01-31 ENCOUNTER — Other Ambulatory Visit: Payer: Self-pay | Admitting: Obstetrics & Gynecology

## 2023-01-31 DIAGNOSIS — Z79890 Hormone replacement therapy (postmenopausal): Secondary | ICD-10-CM

## 2023-02-01 NOTE — Telephone Encounter (Signed)
Med refill request: estradiol Last AEX: 10/10/21 Next AEX: 03/25/23 with [facility] Last MMG (if hormonal med) 12/31/22 Refill authorized: Please Advise, #4 patch, 2 RF

## 2023-03-25 ENCOUNTER — Ambulatory Visit: Payer: Self-pay | Admitting: Radiology

## 2023-04-23 ENCOUNTER — Ambulatory Visit: Payer: Self-pay | Admitting: Radiology

## 2023-06-07 ENCOUNTER — Ambulatory Visit: Payer: Self-pay | Admitting: Radiology

## 2023-07-15 ENCOUNTER — Ambulatory Visit: Payer: Self-pay | Admitting: Radiology

## 2023-07-25 ENCOUNTER — Other Ambulatory Visit: Payer: Self-pay | Admitting: Radiology

## 2023-07-25 DIAGNOSIS — Z79890 Hormone replacement therapy (postmenopausal): Secondary | ICD-10-CM

## 2023-07-26 NOTE — Telephone Encounter (Signed)
 Med refill request: estradiol Last AEX: 10/10/21 Next AEX: 08/12/23 Last MMG (if hormonal med) 12/31/22 Refill authorized: Please Advise, #4 patch, 0 RF

## 2023-08-12 ENCOUNTER — Ambulatory Visit: Payer: Self-pay | Admitting: Radiology

## 2023-08-16 IMAGING — MG DIGITAL DIAGNOSTIC BILAT W/ TOMO W/ CAD
8 of 14 series · 8 of 40 positions shown · non-contrast
Comparison: Previous exams including diagnostic mammogram and
ultrasound dated 06/20/2021.

CLINICAL DATA: Follow-up for probably benign complicated cyst
within the RIGHT breast at the 10 o'clock axis. This probably benign
finding was initially demonstrated on RIGHT breast ultrasound dated
12/19/2020.
TECHNIQUE: Bilateral digital diagnostic mammography and breast tomosynthesis
was performed. The images were evaluated with computer-aided
detection.; Targeted ultrasound examination of the left breast was
performed.; Targeted ultrasound examination of the right breast was
performed

[R ML synth-2D]
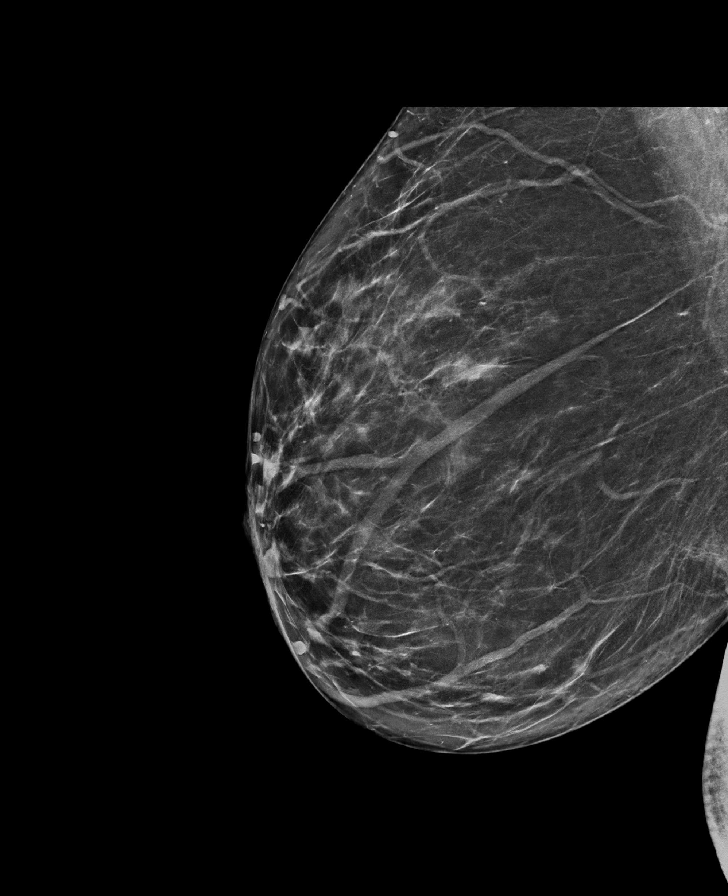

[R CC synth-2D (1 of 2)]
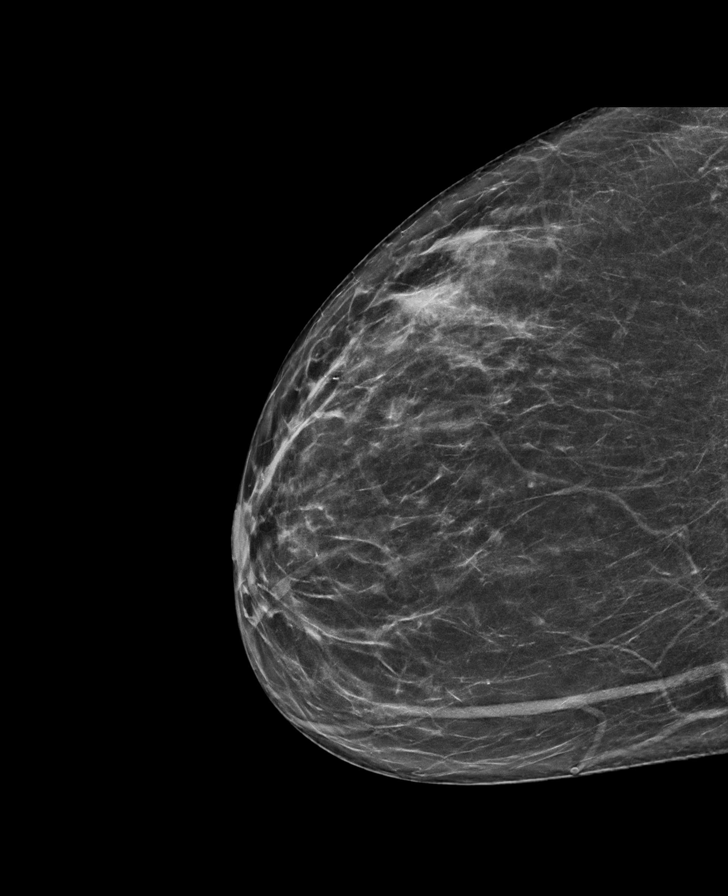

[R CC synth-2D (2 of 2)]
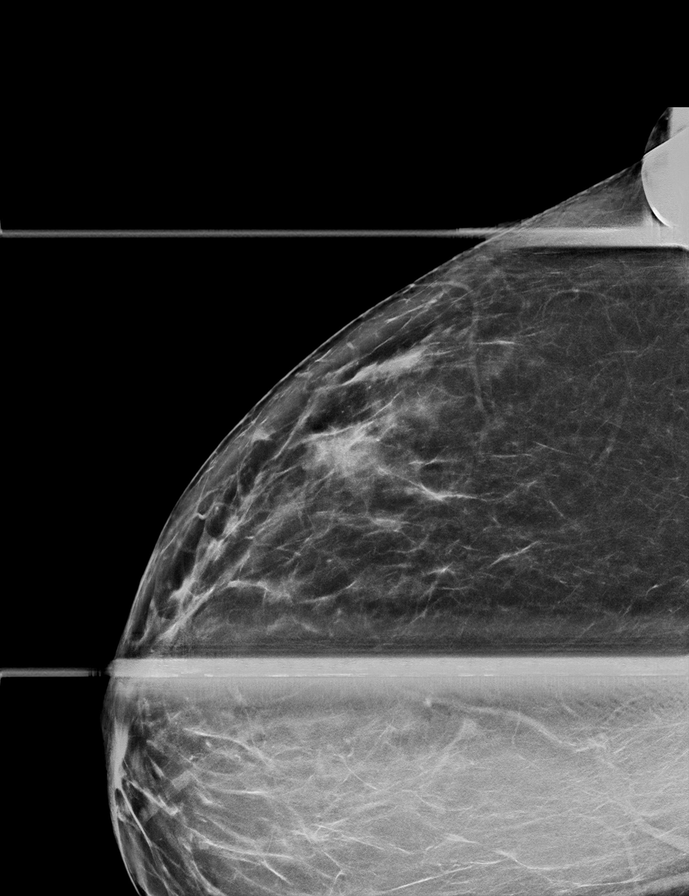

[L CC synth-2D (1 of 2)]
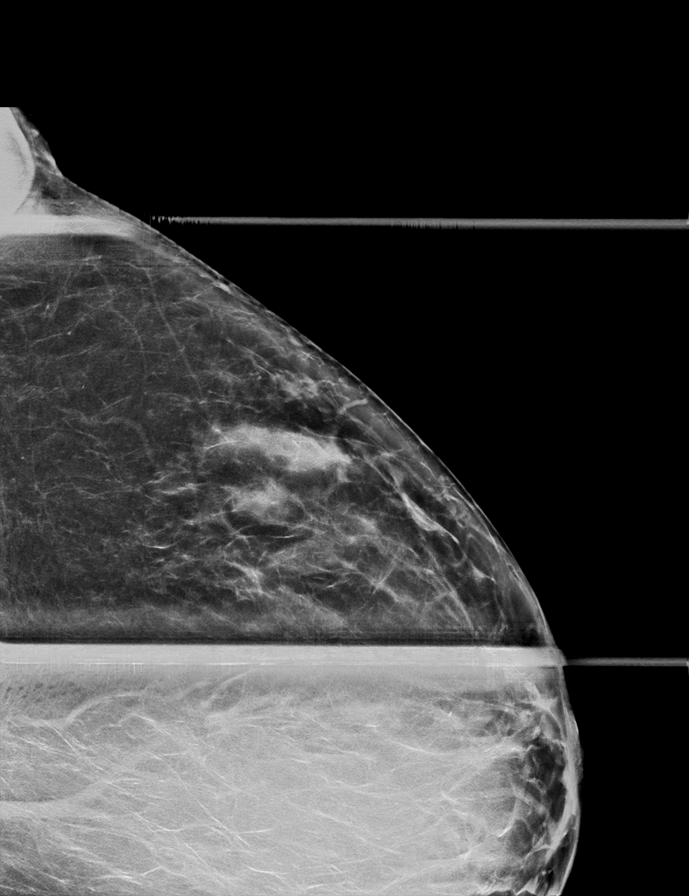

[L MLO synth-2D]
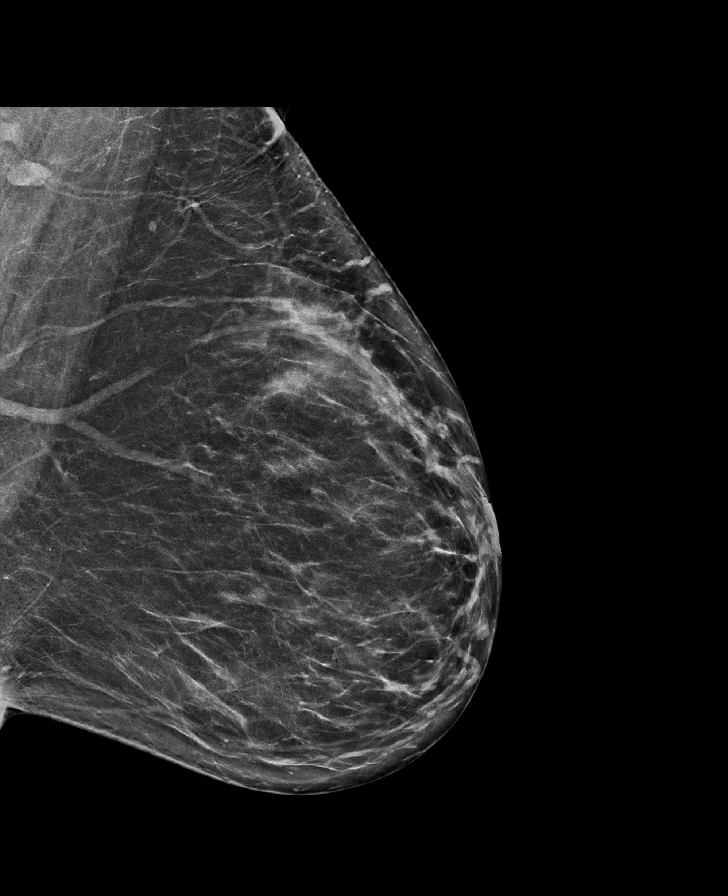

[L CC synth-2D (2 of 2)]
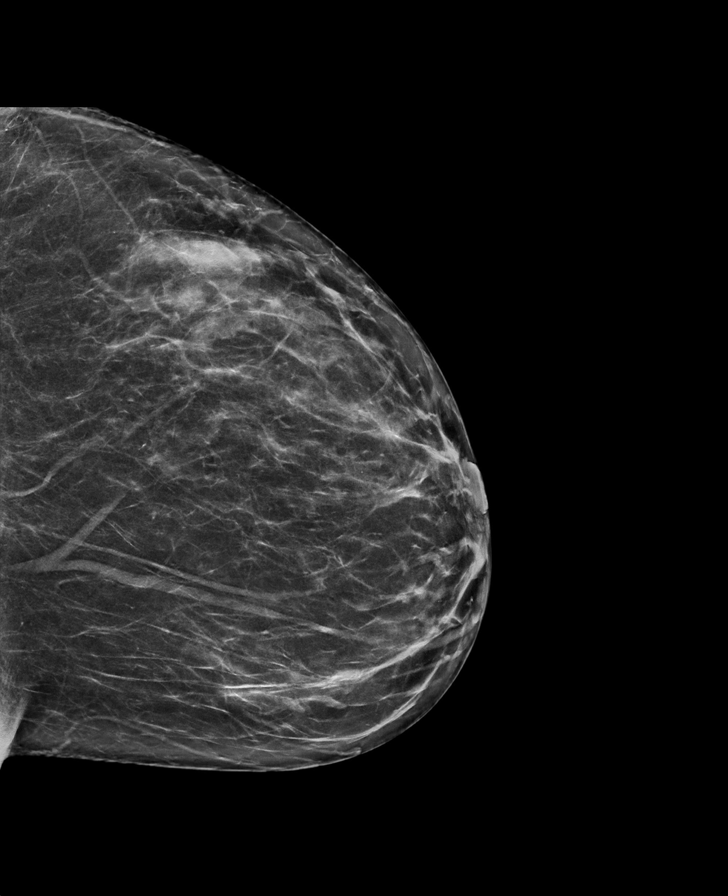

[R MLO synth-2D]
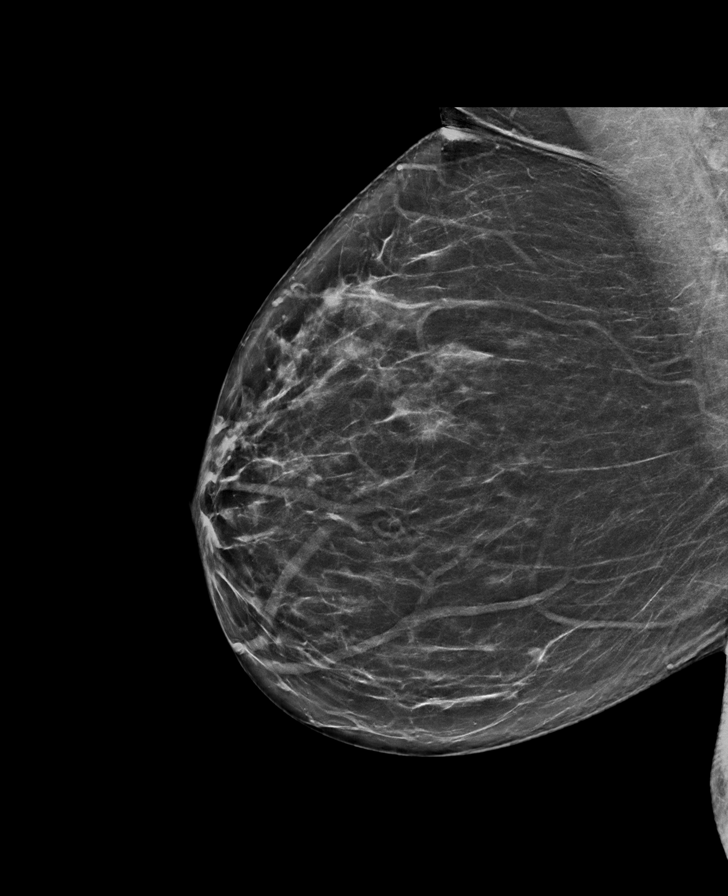

[R ML tomo · tomo slice 39/77.0]
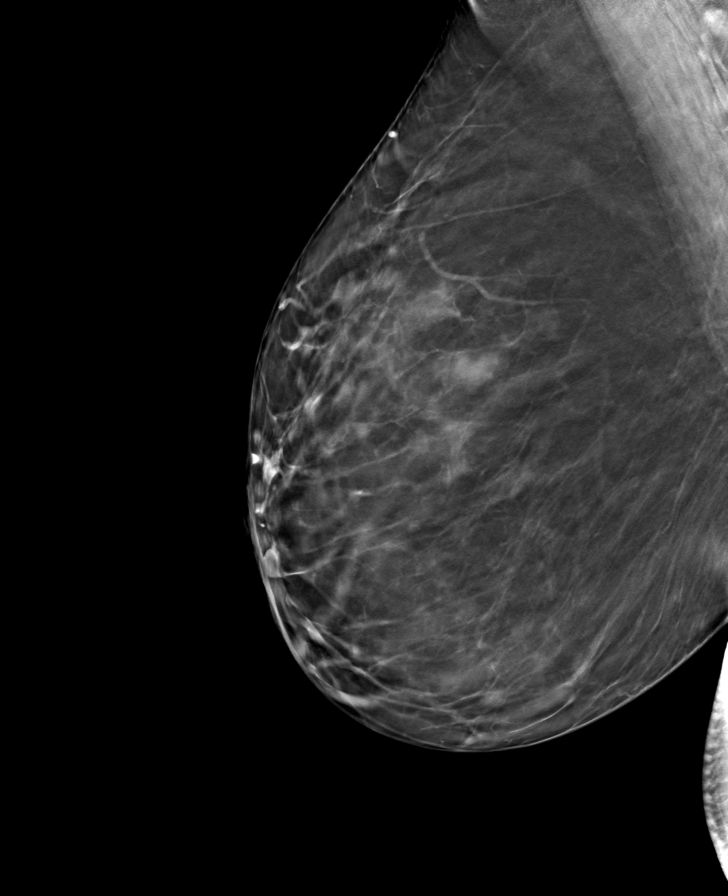

[8 of 40 positions shown; findings below may reference images not displayed]

Per clinical data provided on the previous diagnostic
mammogram/ultrasound report of 06/20/2021, the mass was identified
on an outside exam and the outside report described the mass as
located at the 2 o'clock position but all outside ultrasound images
showed the 8 mm mass to be located at the 10 o'clock position.

EXAM:
DIGITAL DIAGNOSTIC BILATERAL MAMMOGRAM WITH TOMOSYNTHESIS AND CAD;
ULTRASOUND LEFT BREAST LIMITED; ULTRASOUND RIGHT BREAST LIMITED
ACR Breast Density Category b: There are scattered areas of
fibroglandular density.
FINDINGS: There are no new dominant masses, suspicious calcifications or
secondary signs of malignancy within the RIGHT breast.

Focal asymmetry within the outer LEFT breast likely corresponds to
cysts previously demonstrated at the 3 o'clock axis on outside
ultrasound exam of 12/19/2020. Additional ultrasound will be
performed today to confirm benignity.

There are no new dominant masses, suspicious calcifications or
secondary signs of malignancy elsewhere within the LEFT breast.

RIGHT breast: Targeted ultrasound is performed, again showing the
hypoechoic mass in the RIGHT breast at the 10 o'clock axis, 6 cm
from the nipple, measuring 6 x 3 x 6 mm, stable, again most likely
representing benign fibrocystic change.

LEFT breast: Targeted ultrasound is performed, showing multiple
benign cysts within the outer LEFT breast, [DATE] to [DATE] axes,
largest measuring 8 mm, corresponding to the mammographic findings.
IMPRESSION: 1. Stable probably benign fibrocystic change within the RIGHT breast
at the 10 o'clock axis, 6 cm from the nipple, measuring 6 x 3 x 6
mm. Recommend additional follow-up diagnostic exam in 12 months to
ensure 2 year stability.
2. No evidence of malignancy within the LEFT breast. Multiple benign
cysts within the upper-outer quadrant of the LEFT breast.

RECOMMENDATION:
Bilateral diagnostic mammogram, and RIGHT breast ultrasound, in 12
months.

I have discussed the findings and recommendations with the patient.
If applicable, a reminder letter will be sent to the patient
regarding the next appointment.

BI-RADS CATEGORY  3: Probably benign.

## 2023-08-24 ENCOUNTER — Other Ambulatory Visit: Payer: Self-pay | Admitting: Radiology

## 2023-08-24 ENCOUNTER — Other Ambulatory Visit: Payer: Self-pay | Admitting: Nurse Practitioner

## 2023-08-24 DIAGNOSIS — Z79890 Hormone replacement therapy (postmenopausal): Secondary | ICD-10-CM

## 2023-08-24 NOTE — Telephone Encounter (Signed)
Med refill request: estradiol 0.05 mg patch  Last AEX: 10/10/21 Dr. Seymour Bars Next AEX: 09/09/23 Jami Last MMG (if hormonal med) 12/31/22 BI-RADS 2 benign Refill authorized: estradiol 0.05 mg patch #4.  Please approve or deny as appropriate.

## 2023-08-24 NOTE — Telephone Encounter (Signed)
Med refill request:Estradiol 24hr 0.05mg /24 hr patch  Last AEX: 10/10/21 Next AEX: 09/09/23 Last MMG (if hormonal med)12/31/22 birads cat 2 benign  Refill authorized: Last rx ws sent for 4 patches today 08/24/23. Pharmacy is now requesting a 90 day supply. Please approve or deny as appropriate

## 2023-08-26 ENCOUNTER — Ambulatory Visit (INDEPENDENT_AMBULATORY_CARE_PROVIDER_SITE_OTHER): Payer: Managed Care, Other (non HMO) | Admitting: Nurse Practitioner

## 2023-08-26 VITALS — BP 126/84 | HR 82 | Wt 212.2 lb

## 2023-08-26 DIAGNOSIS — N950 Postmenopausal bleeding: Secondary | ICD-10-CM

## 2023-08-26 DIAGNOSIS — Z79890 Hormone replacement therapy (postmenopausal): Secondary | ICD-10-CM | POA: Diagnosis not present

## 2023-08-26 NOTE — Progress Notes (Signed)
   Acute Office Visit  Subjective:    Patient ID: Claudia Matthews, female    DOB: 06-Apr-1966, 58 y.o.   MRN: [id number]   HPI 59 y.o. presents today for bleeding x 3 weeks. Bleeding is moderate, dark red. LMP 8 years ago. On HRT. Has not been taking Prometrium for about 8 months because she had no refills at the pharmacy. Has still been using estradiol patches. Had PMB May 2023, ultrasound recommended but never completed by patient.   Patient's last menstrual period was 10/26/2016 (exact date).    Review of Systems  Constitutional: Negative.   Genitourinary:  Positive for vaginal bleeding.       Objective:    Physical Exam Constitutional:      Appearance: Normal appearance.  Genitourinary:    General: Normal vulva.     Vagina: Vaginal discharge present.     Cervix: Normal.     Uterus: Normal.      Adnexa: Right adnexa normal.     BP 126/84 (BP Location: Left Arm, Patient Position: Sitting, Cuff Size: Large)   Pulse 82   Wt 212 lb 3.2 oz (96.3 kg)   LMP 10/26/2016 (Exact Date) Comment: ablation  SpO2 94%   BMI 32.50 kg/m  Wt Readings from Last 3 Encounters:  08/26/23 212 lb 3.2 oz (96.3 kg)  10/10/21 218 lb (98.9 kg)  08/30/20 213 lb (96.6 kg)        Patient informed chaperone available to be present for breast and/or pelvic exam. Patient has requested no chaperone to be present. Patient has been advised what will be completed during breast and pelvic exam.   Assessment & Plan:   Problem List Items Addressed This Visit   None Visit Diagnoses       Postmenopausal bleeding    -  Primary   Relevant Orders   US PELVIS TRANSVAGINAL NON-OB (TV ONLY)     Postmenopausal hormone therapy          Plan: Schedule pelvic ultrasound. Possible EMB. Stop HRT. Discussed risk of endometrial cancer and irregular bleeding with unopposed estrogen.    Olivia Mackie DNP, 2:56 PM 08/26/2023

## 2023-08-31 ENCOUNTER — Other Ambulatory Visit: Payer: Self-pay | Admitting: Obstetrics and Gynecology

## 2023-08-31 DIAGNOSIS — Z79890 Hormone replacement therapy (postmenopausal): Secondary | ICD-10-CM

## 2023-08-31 DIAGNOSIS — N950 Postmenopausal bleeding: Secondary | ICD-10-CM

## 2023-09-02 ENCOUNTER — Ambulatory Visit: Payer: Self-pay | Admitting: Radiology

## 2023-09-07 ENCOUNTER — Ambulatory Visit: Payer: Managed Care, Other (non HMO) | Admitting: Obstetrics and Gynecology

## 2023-09-07 ENCOUNTER — Ambulatory Visit (INDEPENDENT_AMBULATORY_CARE_PROVIDER_SITE_OTHER): Payer: Managed Care, Other (non HMO)

## 2023-09-07 VITALS — BP 118/78 | HR 82 | Temp 97.8°F | Wt 208.0 lb

## 2023-09-07 DIAGNOSIS — N950 Postmenopausal bleeding: Secondary | ICD-10-CM | POA: Insufficient documentation

## 2023-09-07 DIAGNOSIS — R9389 Abnormal findings on diagnostic imaging of other specified body structures: Secondary | ICD-10-CM | POA: Diagnosis not present

## 2023-09-07 NOTE — Progress Notes (Unsigned)
 59 y.o. [id number] female s/p endometrial ablation, left oophorectomy with prior endometrial ablation here for TVUS for PMB. Divorced. Traveling >1hr for appts.  Patient's last menstrual period was 10/26/2016 (exact date).   On 08/26/2023 patient was seen by T.Earlene Plater, NP for postmenopausal bleeding.  She reported she was taking estrogen only for 6 month due to a prescription refill error. She had been out of progesterone. Stopped HRT at last appt.  Started phentermine for weight loss with good success. Not sleeping well since stopping HRT. Would like to restart after evaluation.  GYN HISTORY: Surgical history as noted  OB History  Gravida Para Term Preterm AB Living  4 3 1 2 1 3   SAB IAB Ectopic Multiple Live Births  1    3    # Outcome Date GA Lbr Len/2nd Weight Sex Type Anes PTL Lv  4 SAB 07/13/97          3 Preterm 01/26/96     Vag-Spont   LIV  2 Preterm 05/22/92     Vag-Spont   LIV  1 Term 05/15/84     Vag-Spont   LIV    Past Medical History:  Diagnosis Date   Anxiety    Thyroid disease     Past Surgical History:  Procedure Laterality Date   ABLATION     Bone Graft     CHOLECYSTECTOMY     DILATION AND CURETTAGE OF UTERUS     EYE SURGERY Bilateral    "lazy eye"   OOPHORECTOMY Left     Current Outpatient Medications on File Prior to Visit  Medication Sig Dispense Refill   ALPRAZolam (XANAX) 0.5 MG tablet Take 0.5 mg by mouth 2 (two) times daily as needed.     cyclobenzaprine (FLEXERIL) 10 MG tablet Take 10 mg by mouth daily as needed.     fluticasone (FLONASE) 50 MCG/ACT nasal spray SMARTSIG:1-2 Spray(s) Both Nares Daily     levothyroxine (SYNTHROID) 137 MCG tablet Take 137 mcg by mouth daily.     metoprolol tartrate (LOPRESSOR) 50 MG tablet Take 50 mg by mouth 2 (two) times daily.     phentermine (ADIPEX-P) 37.5 MG tablet Take 37.5 mg by mouth daily.     No current facility-administered medications on file prior to visit.    Allergies  Allergen Reactions    Penicillins Hives   Sulfa Antibiotics Hives           PE Today's Vitals   09/07/23 1434  BP: 118/78  Pulse: 82  Temp: 97.8 F (36.6 C)  TempSrc: Oral  SpO2: 99%  Weight: 208 lb (94.3 kg)   Body mass index is 31.86 kg/m.  Physical Exam Vitals reviewed.  Constitutional:      General: She is not in acute distress.    Appearance: Normal appearance.  HENT:     Head: Normocephalic and atraumatic.     Nose: Nose normal.  Eyes:     Extraocular Movements: Extraocular movements intact.     Conjunctiva/sclera: Conjunctivae normal.  Pulmonary:     Effort: Pulmonary effort is normal.  Musculoskeletal:        General: Normal range of motion.     Cervical back: Normal range of motion.  Neurological:     General: No focal deficit present.     Mental Status: She is alert.  Psychiatric:        Mood and Affect: Mood normal.        Behavior: Behavior normal.  09/09/23 TVUS: Indications: Postmenopausal bleeding   Findings:    Uterus: 8.4 x 5.6 x 5.1 cm, anteverted.  Heterogeneous appearance of the myometrium suggestive of adenomyosis. Endometrial thickness: 4.4 mm. Fibroids: 1-2.0 x 2.1 cm, intramural. 2-2.3 x 2.1 cm, mural. Left ovary: Surgically absent. Right ovary: 2.2 x 2.0 x 1.6 cm.  Normal-appearing. No free fluid.   Impression:  Thickened endometrium. 2.  Uterine fibroids present.  Assessment and Plan:        Postmenopausal bleeding Assessment & Plan: Reviewed ultrasound findings with patient. Thick endometrium noted. Recommend endometrial sampling Continue to hold HRT at this time.  Orders: -     Endometrial biopsy; Future  Thickened endometrium Assessment & Plan: ET 4.26mm Recommend endometrial assessment  via EMB or diagnostic hysteroscopy.  Patient elects for EMB.   Orders: -     Endometrial biopsy; Future    Rosalyn Gess, MD

## 2023-09-07 NOTE — Assessment & Plan Note (Signed)
 ET 4.24mm Recommend endometrial assessment  via EMB or diagnostic hysteroscopy.  Patient elects for EMB.

## 2023-09-09 ENCOUNTER — Other Ambulatory Visit (HOSPITAL_COMMUNITY)
Admission: RE | Admit: 2023-09-09 | Discharge: 2023-09-09 | Disposition: A | Discharge status: Home / Self Care | Source: Ambulatory Visit | Attending: Radiology | Admitting: Radiology

## 2023-09-09 ENCOUNTER — Ambulatory Visit (INDEPENDENT_AMBULATORY_CARE_PROVIDER_SITE_OTHER): Payer: Managed Care, Other (non HMO) | Admitting: Radiology

## 2023-09-09 ENCOUNTER — Encounter: Payer: Self-pay | Admitting: Radiology

## 2023-09-09 ENCOUNTER — Encounter: Payer: Self-pay | Admitting: Obstetrics and Gynecology

## 2023-09-09 VITALS — BP 138/86 | Ht 67.25 in | Wt 208.4 lb

## 2023-09-09 DIAGNOSIS — Z01419 Encounter for gynecological examination (general) (routine) without abnormal findings: Secondary | ICD-10-CM

## 2023-09-09 DIAGNOSIS — R9389 Abnormal findings on diagnostic imaging of other specified body structures: Secondary | ICD-10-CM

## 2023-09-09 DIAGNOSIS — N950 Postmenopausal bleeding: Secondary | ICD-10-CM

## 2023-09-09 DIAGNOSIS — Z1331 Encounter for screening for depression: Secondary | ICD-10-CM | POA: Diagnosis not present

## 2023-09-09 NOTE — Progress Notes (Signed)
   Claudia Matthews 1965-11-09 [id number]   History: Postmenopausal 58 y.o. presents for annual exam. Has EMB scheduled next week for PMB, thickened endometrium on ERT, ran out of progesterone. Started on hydrochlorothiazide for leg swelling today at PCP. She is on phentermine for weight loss.   Gynecologic History Postmenopausal Last Pap: 2021. Results were: normal Last mammogram: 2024. Results were: normal Last colonoscopy: 10 years ago  Obstetric History OB History  Gravida Para Term Preterm AB Living  4 3 1 2 1 3   SAB IAB Ectopic Multiple Live Births  1    3    # Outcome Date GA Lbr Len/2nd Weight Sex Type Anes PTL Lv  4 SAB 07/13/97          3 Preterm 01/26/96     Vag-Spont   LIV  2 Preterm 05/22/92     Vag-Spont   LIV  1 Term 05/15/84     Vag-Spont   LIV       09/09/2023    3:24 PM  Depression screen PHQ 2/9  Decreased Interest 0  Down, Depressed, Hopeless 0  PHQ - 2 Score 0     The following portions of the patient's history were reviewed and updated as appropriate: allergies, current medications, past family history, past medical history, past social history, past surgical history, and problem list.  Review of Systems Pertinent items noted in HPI and remainder of comprehensive ROS otherwise negative.  Past medical history, past surgical history, family history and social history were all reviewed and documented in the EPIC chart.  Exam:  Vitals:   09/09/23 1522  BP: 138/86  Weight: 208 lb 6.4 oz (94.5 kg)  Height: 5' 7.25" (1.708 m)   Body mass index is 32.4 kg/m.  General appearance:  Normal, obese Thyroid:  Symmetrical, normal in size, without palpable masses or nodularity. Respiratory  Auscultation:  Clear without wheezing or rhonchi Cardiovascular  Auscultation:  Regular rate, without rubs, murmurs or gallops  Edema/varicosities:  Not grossly evident Abdominal  Soft,nontender, without masses, guarding or rebound.  Liver/spleen:  No organomegaly  noted  Hernia:  None appreciated  Skin  Inspection:  Grossly normal Breasts: Examined lying and sitting.   Right: Without masses, retractions, nipple discharge or axillary adenopathy.   Left: Without masses, retractions, nipple discharge or axillary adenopathy. Genitourinary   Inguinal/mons:  Normal without inguinal adenopathy  External genitalia:  Normal appearing vulva with no masses, tenderness, or lesions  BUS/Urethra/Skene's glands:  Normal  Vagina:  Normal appearing with normal color and discharge, no lesions. Atrophy: mild   Cervix:  Normal appearing without discharge or lesions  Uterus:  Normal in size, shape and contour.  Midline and mobile, nontender  Adnexa/parametria:     Rt: Normal in size, without masses or tenderness.   Lt: Normal in size, without masses or tenderness.  Anus and perineum: Normal    Raynelle Fanning, CMA present for exam  Assessment/Plan:   1. Well woman exam with routine gynecological exam (Primary) - Cytology - PAP( [facility])  2. Thickened endometrium  3. Postmenopausal bleeding Keep appt for EMB   Advised patient to stop phentermine- should not be taking with HTN and being on metoprolol.  Discussed SBE, colonoscopy and DEXA screening as directed. Recommend of exercise weekly, including weight bearing exercise. Encouraged the use of seatbelts and sunscreen.  Return in 1 year for annual or sooner prn.  Marlis Oldaker B WHNP-BC, 3:50 PM 09/09/2023

## 2023-09-09 NOTE — Assessment & Plan Note (Addendum)
 Reviewed ultrasound findings with patient. Thick endometrium noted. Recommend endometrial sampling Continue to hold HRT at this time.

## 2023-09-14 LAB — CYTOLOGY - PAP
Adequacy: ABSENT
Comment: NEGATIVE
Diagnosis: NEGATIVE
Diagnosis: REACTIVE
High risk HPV: NEGATIVE

## 2023-09-15 ENCOUNTER — Ambulatory Visit: Payer: Managed Care, Other (non HMO) | Admitting: Obstetrics and Gynecology

## 2023-10-11 ENCOUNTER — Ambulatory Visit: Admitting: Obstetrics and Gynecology

## 2023-10-11 ENCOUNTER — Encounter: Payer: Self-pay | Admitting: Obstetrics and Gynecology

## 2023-10-11 ENCOUNTER — Other Ambulatory Visit (HOSPITAL_COMMUNITY)
Admission: RE | Admit: 2023-10-11 | Discharge: 2023-10-11 | Disposition: A | Discharge status: Home / Self Care | Source: Ambulatory Visit | Attending: Obstetrics and Gynecology | Admitting: Obstetrics and Gynecology

## 2023-10-11 VITALS — BP 122/76 | HR 64 | Temp 97.9°F | Wt 207.0 lb

## 2023-10-11 DIAGNOSIS — N950 Postmenopausal bleeding: Secondary | ICD-10-CM | POA: Diagnosis not present

## 2023-10-11 DIAGNOSIS — Z01812 Encounter for preprocedural laboratory examination: Secondary | ICD-10-CM

## 2023-10-11 DIAGNOSIS — S335XXA Sprain of ligaments of lumbar spine, initial encounter: Secondary | ICD-10-CM

## 2023-10-11 DIAGNOSIS — R9389 Abnormal findings on diagnostic imaging of other specified body structures: Secondary | ICD-10-CM | POA: Diagnosis present

## 2023-10-11 LAB — PREGNANCY, URINE: Preg Test, Ur: NEGATIVE

## 2023-10-11 MED ORDER — CYCLOBENZAPRINE HCL 10 MG PO TABS: 10.0000 mg | ORAL_TABLET | Freq: Three times a day (TID) | ORAL | 0 refills | Status: AC | PRN

## 2023-10-11 NOTE — Patient Instructions (Signed)
 It is common to have vaginal bleeding and cramping for up to 72 hours after your biopsy. Please call our office with heavy vaginal bleeding, severe abdominal pain or fever. Avoid intercourse, douching and baths for 7 days to decrease the risk of infection.

## 2023-10-11 NOTE — Progress Notes (Signed)
 58 y.o. [id number] female s/p endometrial ablation, left oophorectomy with prior endometrial ablation here for EMB for thickened endometrium in setting of PMB. Divorced. Traveling >1hr for appts.  Patient's last menstrual period was 10/26/2016 (exact date).   On 08/26/2023 patient was seen by T.Earlene Plater, NP for postmenopausal bleeding x 3 weeks:moderate, dark red. She reported she was taking estrogen only for 6 month due to a prescription refill error. She had been out of progesterone. Stopped HRT at that appt.  Started phentermine for weight loss with good success. Not sleeping well since stopping HRT. Would like to restart after evaluation. Pt hurt back lifting a case of water this morning. She has never sprained her back before. No other concerns.   GYN HISTORY: Surgical history as noted  OB History  Gravida Para Term Preterm AB Living  4 3 1 2 1 3   SAB IAB Ectopic Multiple Live Births  1    3    # Outcome Date GA Lbr Len/2nd Weight Sex Type Anes PTL Lv  4 SAB 07/13/97          3 Preterm 01/26/96     Vag-Spont   LIV  2 Preterm 05/22/92     Vag-Spont   LIV  1 Term 05/15/84     Vag-Spont   LIV    Past Medical History:  Diagnosis Date   Anxiety    Thyroid disease     Past Surgical History:  Procedure Laterality Date   ABLATION     Bone Graft     CHOLECYSTECTOMY     DILATION AND CURETTAGE OF UTERUS     EYE SURGERY Bilateral    "lazy eye"   OOPHORECTOMY Left     Current Outpatient Medications on File Prior to Visit  Medication Sig Dispense Refill   ALPRAZolam (XANAX) 0.5 MG tablet Take 0.5 mg by mouth 2 (two) times daily as needed.     bacitracin 500 UNIT/GM ointment Apply topically 2 (two) times daily as needed.     cyclobenzaprine (FLEXERIL) 10 MG tablet Take 10 mg by mouth daily as needed.     fluticasone (FLONASE) 50 MCG/ACT nasal spray SMARTSIG:1-2 Spray(s) Both Nares Daily     hydrochlorothiazide (HYDRODIURIL) 12.5 MG tablet Take 12.5 mg by mouth daily.      levothyroxine (SYNTHROID) 137 MCG tablet Take 137 mcg by mouth daily.     metoprolol tartrate (LOPRESSOR) 50 MG tablet Take 50 mg by mouth 2 (two) times daily.     phentermine (ADIPEX-P) 37.5 MG tablet Take 37.5 mg by mouth daily. (Patient not taking: Reported on 10/11/2023)     No current facility-administered medications on file prior to visit.    Allergies  Allergen Reactions   Penicillins Hives   Sulfa Antibiotics Hives           PE Today's Vitals   10/11/23 0812  BP: 122/76  Pulse: 64  Temp: 97.9 F (36.6 C)  TempSrc: Oral  SpO2: 99%  Weight: 207 lb (93.9 kg)   Body mass index is 32.18 kg/m.  Physical Exam Vitals reviewed. Exam conducted with a chaperone present.  Constitutional:      General: She is not in acute distress.    Appearance: Normal appearance.  HENT:     Head: Normocephalic and atraumatic.     Nose: Nose normal.  Eyes:     Extraocular Movements: Extraocular movements intact.     Conjunctiva/sclera: Conjunctivae normal.  Pulmonary:     Effort: Pulmonary effort is  normal.  Genitourinary:    General: Normal vulva.     Exam position: Lithotomy position.     Vagina: Normal. No vaginal discharge.     Cervix: Normal. No cervical motion tenderness, discharge or lesion.     Uterus: Normal. Not enlarged and not tender.      Adnexa: Right adnexa normal and left adnexa normal.  Musculoskeletal:        General: Normal range of motion.       Arms:     Cervical back: Normal range of motion.     Comments: Tension of R lower back muscles  Neurological:     General: No focal deficit present.     Mental Status: She is alert.  Psychiatric:        Mood and Affect: Mood normal.        Behavior: Behavior normal.      Procedure Consent was signed. Timeout was performed. Speculum inserted into the vagina, cervix visualized and was prepped with Betadine.  Patient declined cervical block. A single-toothed tenaculum was placed on the anterior lip of the  cervix to stabilize it.  The 3 mm pipelle was introduced into the endometrial cavity without difficulty to a depth of 7cm, suction initiated and a moderate amount of tissue was obtained and sent to pathology.  The instruments were removed from the patient's vagina.  Minimal bleeding from the cervix was noted.  The patient tolerated the procedure well.    Assessment and Plan:        Pre-procedure lab exam -     Pregnancy, urine  Postmenopausal bleeding -     Surgical pathology  Thickened endometrium -     Surgical pathology  Low back sprain, initial encounter -     Cyclobenzaprine HCl; Take 1 tablet (10 mg total) by mouth 3 (three) times daily as needed for muscle spasms.  Dispense: 30 tablet; Refill: 0    Rosalyn Gess, MD

## 2023-10-12 LAB — SURGICAL PATHOLOGY

## 2023-10-13 DIAGNOSIS — Z79890 Hormone replacement therapy (postmenopausal): Secondary | ICD-10-CM

## 2023-10-13 MED ORDER — PROGESTERONE MICRONIZED 100 MG PO CAPS: 100.0000 mg | ORAL_CAPSULE | Freq: Every day | ORAL | 3 refills | Status: DC

## 2023-10-13 MED ORDER — ESTRADIOL 0.05 MG/24HR TD PTWK: 0.0500 mg | MEDICATED_PATCH | TRANSDERMAL | 3 refills | Status: AC

## 2023-10-13 NOTE — Telephone Encounter (Signed)
 Pt LVM stating that she received notice about her results and that she can restart would like to restart HRT but reports that she doesn't have any. So, is requesting a refill.   Med refill request: Estradiol/Prometrium  Last AEX: 09/09/2023-JC Next AEX: nothing currently, recall placed for 2026.  Last MMG (if hormonal med): 12/2022-WNL Refill authorized: rxs pend.

## 2023-12-08 ENCOUNTER — Encounter: Payer: Self-pay | Admitting: Obstetrics and Gynecology

## 2023-12-08 DIAGNOSIS — Z79890 Hormone replacement therapy (postmenopausal): Secondary | ICD-10-CM

## 2023-12-09 NOTE — Telephone Encounter (Signed)
 Spoke with patient she is aware of recommendation and will keep us  updated.

## 2023-12-24 NOTE — Telephone Encounter (Signed)
 Spoke with patient. Patient reports she is still bleeding. Bleeding is not as heavy as it was prior to EMB. Changing full Super tampon tid. Small nickel size clots this morning. Denies pain, s/s of anemia. Taking progesterone  100 mg nightly and estradiol  patch. Patient expressed frustration with continued bleeding.   OV scheduled for 6/27 at 0930 with Dr. Andrena Ke.   Bleeding precautions reviewed.   Advised I will provide update to Dr. Andrena Ke, our office will return call if any additional recommendations. Patient agreeable.   Routing to provider for final review. Patient is agreeable to disposition. Will close encounter.

## 2023-12-24 NOTE — Telephone Encounter (Signed)
 Spoke with patient. Advised per Dr. Andrena Ke to increase Prometrium  to 200mg  nightly

## 2023-12-31 MED ORDER — PROGESTERONE MICRONIZED 100 MG PO CAPS: 200.0000 mg | ORAL_CAPSULE | Freq: Every day | ORAL | 3 refills | Status: AC

## 2023-12-31 NOTE — Addendum Note (Signed)
 Addended by: Cleone Dad V on: 12/31/2023 12:11 PM   Modules accepted: Orders

## 2023-12-31 NOTE — Telephone Encounter (Signed)
 Dr. Andrena Ke -patient was previously instructed to increase Prometrium  to 200 mg, do you want to send a new prescription for prometrium ?

## 2023-12-31 NOTE — Telephone Encounter (Signed)
 New rx sent

## 2024-01-03 NOTE — Telephone Encounter (Signed)
 Routing to Dr. Dallie -see update.   Please advise on plan of care.

## 2024-01-05 NOTE — Telephone Encounter (Signed)
 Dr. Dallie -please review previous messages and advise if patient can be worked into schedule morning of 7/1.

## 2024-01-06 NOTE — Telephone Encounter (Signed)
 Dr. Dallie -please review. Patient was offered 7/1 at 2:30 previously and declined.

## 2024-01-07 ENCOUNTER — Ambulatory Visit: Admitting: Obstetrics and Gynecology

## 2024-01-10 ENCOUNTER — Ambulatory Visit: Admitting: Obstetrics and Gynecology

## 2024-01-19 ENCOUNTER — Ambulatory Visit: Payer: Self-pay | Admitting: Radiology

## 2024-01-21 ENCOUNTER — Ambulatory Visit: Payer: Self-pay | Admitting: Obstetrics and Gynecology

## 2024-01-24 NOTE — Telephone Encounter (Signed)
 Routing to Dr. Dallie to review prior to cancelling.

## 2024-01-27 ENCOUNTER — Ambulatory Visit: Payer: Self-pay | Admitting: Obstetrics and Gynecology

## 2024-02-22 ENCOUNTER — Other Ambulatory Visit: Payer: Self-pay | Admitting: Obstetrics and Gynecology

## 2024-02-22 DIAGNOSIS — Z1231 Encounter for screening mammogram for malignant neoplasm of breast: Secondary | ICD-10-CM

## 2024-03-09 ENCOUNTER — Ambulatory Visit: Payer: Self-pay | Admitting: Obstetrics and Gynecology

## 2024-03-16 ENCOUNTER — Ambulatory Visit: Payer: Self-pay | Admitting: Obstetrics and Gynecology

## 2024-03-17 ENCOUNTER — Ambulatory Visit

## 2024-03-24 ENCOUNTER — Ambulatory Visit
Admission: RE | Admit: 2024-03-24 | Discharge: 2024-03-24 | Disposition: A | Discharge status: Home / Self Care | Source: Ambulatory Visit | Attending: Obstetrics and Gynecology | Admitting: Obstetrics and Gynecology

## 2024-03-24 DIAGNOSIS — Z1231 Encounter for screening mammogram for malignant neoplasm of breast: Secondary | ICD-10-CM

## 2024-03-29 ENCOUNTER — Ambulatory Visit: Payer: Self-pay | Admitting: Obstetrics and Gynecology
